# Patient Record
Sex: Female | Born: 1955 | Race: Black or African American | Hispanic: No | Marital: Single
Health system: Southern US, Community
[De-identification: ages and names within clinical notes are randomized; demographics above are authoritative.]

## PROBLEM LIST (undated history)

## (undated) DIAGNOSIS — I2510 Atherosclerotic heart disease of native coronary artery without angina pectoris: Secondary | ICD-10-CM

## (undated) DIAGNOSIS — E119 Type 2 diabetes mellitus without complications: Secondary | ICD-10-CM

## (undated) DIAGNOSIS — E785 Hyperlipidemia, unspecified: Secondary | ICD-10-CM

## (undated) DIAGNOSIS — I10 Essential (primary) hypertension: Secondary | ICD-10-CM

## (undated) HISTORY — DX: Essential (primary) hypertension: I10

## (undated) HISTORY — PX: ABDOMINAL HYSTERECTOMY: SHX81

## (undated) HISTORY — DX: Hyperlipidemia, unspecified: E78.5

---

## 1997-10-08 ENCOUNTER — Ambulatory Visit (HOSPITAL_COMMUNITY): Admission: RE | Admit: 1997-10-08 | Discharge: 1997-10-08 | Payer: Self-pay

## 1998-01-19 HISTORY — PX: KNEE SURGERY: SHX244

## 1998-06-25 ENCOUNTER — Encounter: Admission: RE | Admit: 1998-06-25 | Discharge: 1998-06-25 | Payer: Self-pay | Admitting: *Deleted

## 1998-10-18 ENCOUNTER — Encounter (HOSPITAL_BASED_OUTPATIENT_CLINIC_OR_DEPARTMENT_OTHER): Payer: Self-pay | Admitting: General Surgery

## 1998-10-18 ENCOUNTER — Ambulatory Visit (HOSPITAL_COMMUNITY): Admission: RE | Admit: 1998-10-18 | Discharge: 1998-10-18 | Payer: Self-pay | Admitting: General Surgery

## 1999-03-14 ENCOUNTER — Emergency Department (HOSPITAL_COMMUNITY): Admission: EM | Admit: 1999-03-14 | Discharge: 1999-03-14 | Payer: Self-pay

## 1999-03-14 ENCOUNTER — Encounter: Payer: Self-pay | Admitting: Emergency Medicine

## 1999-03-16 ENCOUNTER — Encounter: Payer: Self-pay | Admitting: Emergency Medicine

## 1999-03-16 ENCOUNTER — Emergency Department (HOSPITAL_COMMUNITY): Admission: EM | Admit: 1999-03-16 | Discharge: 1999-03-16 | Payer: Self-pay | Admitting: Emergency Medicine

## 1999-08-04 ENCOUNTER — Other Ambulatory Visit: Admission: RE | Admit: 1999-08-04 | Discharge: 1999-08-04 | Payer: Self-pay | Admitting: Gynecology

## 1999-08-14 ENCOUNTER — Ambulatory Visit (HOSPITAL_COMMUNITY): Admission: RE | Admit: 1999-08-14 | Discharge: 1999-08-14 | Payer: Self-pay | Admitting: Obstetrics and Gynecology

## 1999-08-14 ENCOUNTER — Encounter: Payer: Self-pay | Admitting: Obstetrics and Gynecology

## 2000-02-18 ENCOUNTER — Encounter (HOSPITAL_BASED_OUTPATIENT_CLINIC_OR_DEPARTMENT_OTHER): Payer: Self-pay | Admitting: General Surgery

## 2000-02-18 ENCOUNTER — Ambulatory Visit (HOSPITAL_COMMUNITY): Admission: RE | Admit: 2000-02-18 | Discharge: 2000-02-18 | Payer: Self-pay | Admitting: General Surgery

## 2001-03-22 ENCOUNTER — Ambulatory Visit (HOSPITAL_COMMUNITY): Admission: RE | Admit: 2001-03-22 | Discharge: 2001-03-22 | Payer: Self-pay | Admitting: Family Medicine

## 2001-03-22 ENCOUNTER — Encounter: Payer: Self-pay | Admitting: Family Medicine

## 2002-03-28 ENCOUNTER — Encounter: Payer: Self-pay | Admitting: Family Medicine

## 2002-03-28 ENCOUNTER — Ambulatory Visit (HOSPITAL_COMMUNITY): Admission: RE | Admit: 2002-03-28 | Discharge: 2002-03-28 | Payer: Self-pay | Admitting: Family Medicine

## 2003-04-03 ENCOUNTER — Ambulatory Visit (HOSPITAL_COMMUNITY): Admission: RE | Admit: 2003-04-03 | Discharge: 2003-04-03 | Payer: Self-pay | Admitting: Family Medicine

## 2003-05-02 ENCOUNTER — Ambulatory Visit (HOSPITAL_BASED_OUTPATIENT_CLINIC_OR_DEPARTMENT_OTHER): Admission: RE | Admit: 2003-05-02 | Discharge: 2003-05-02 | Payer: Self-pay | Admitting: Orthopedic Surgery

## 2003-05-02 ENCOUNTER — Ambulatory Visit (HOSPITAL_COMMUNITY): Admission: RE | Admit: 2003-05-02 | Discharge: 2003-05-02 | Payer: Self-pay | Admitting: Orthopedic Surgery

## 2003-07-16 ENCOUNTER — Ambulatory Visit: Admission: RE | Admit: 2003-07-16 | Discharge: 2003-07-16 | Payer: Self-pay | Admitting: Family Medicine

## 2003-07-17 ENCOUNTER — Inpatient Hospital Stay (HOSPITAL_COMMUNITY): Admission: EM | Admit: 2003-07-17 | Discharge: 2003-07-25 | Payer: Self-pay | Admitting: Emergency Medicine

## 2003-07-18 ENCOUNTER — Encounter (INDEPENDENT_AMBULATORY_CARE_PROVIDER_SITE_OTHER): Payer: Self-pay | Admitting: Specialist

## 2004-09-19 ENCOUNTER — Ambulatory Visit (HOSPITAL_COMMUNITY): Admission: RE | Admit: 2004-09-19 | Discharge: 2004-09-19 | Payer: Self-pay | Admitting: Family Medicine

## 2004-10-01 ENCOUNTER — Encounter: Admission: RE | Admit: 2004-10-01 | Discharge: 2004-10-01 | Payer: Self-pay | Admitting: Family Medicine

## 2005-10-12 ENCOUNTER — Ambulatory Visit (HOSPITAL_COMMUNITY): Admission: RE | Admit: 2005-10-12 | Discharge: 2005-10-12 | Payer: Self-pay | Admitting: Family Medicine

## 2006-10-15 ENCOUNTER — Ambulatory Visit (HOSPITAL_COMMUNITY): Admission: RE | Admit: 2006-10-15 | Discharge: 2006-10-15 | Payer: Self-pay | Admitting: Family Medicine

## 2007-10-24 ENCOUNTER — Ambulatory Visit (HOSPITAL_COMMUNITY): Admission: RE | Admit: 2007-10-24 | Discharge: 2007-10-24 | Payer: Self-pay | Admitting: Family Medicine

## 2008-11-12 ENCOUNTER — Encounter: Admission: RE | Admit: 2008-11-12 | Discharge: 2008-11-12 | Payer: Self-pay | Admitting: Family Medicine

## 2009-11-18 ENCOUNTER — Encounter: Admission: RE | Admit: 2009-11-18 | Discharge: 2009-11-18 | Payer: Self-pay | Admitting: Family Medicine

## 2010-02-09 ENCOUNTER — Encounter: Payer: Self-pay | Admitting: Family Medicine

## 2010-06-06 NOTE — Op Note (Signed)
NAMETEKEYA, GEFFERT NO.:  [id number]   MEDICAL RECORD NO.:  [id number]                   PATIENT TYPE:  INP   LOCATION:  0445                                 FACILITY:  [facility]   PHYSICIAN:  Leonie Man, M.D.                DATE OF BIRTH:  May 22, 1955   DATE OF PROCEDURE:  07/18/2003  DATE OF DISCHARGE:                                 OPERATIVE REPORT   PREOPERATIVE DIAGNOSIS:  Small bowel obstruction.   POSTOPERATIVE DIAGNOSIS:  Closed-loop small bowel obstruction.   PROCEDURE:  Exploratory laparotomy for relief of small bowel obstruction  with small bowel resection and anastomosis.   SURGEON:  Leonie Man, M.D.   ASSISTANT:  Lebron Conners, M.D.   ANESTHESIA:  General.   Patient is a 55 year old woman presenting with abdominal pain and a CT scan  showing enteritis.  On initial evaluation, she was noted to have a rather  soft abdomen and no peritoneal signs.  She was presently evaluated later to  find that her abdominal distention became more pronounced and associated  with leukocytosis.  A CT scan for the abdomen did in fact show an area of  obstruction consistent with what is possibly a closed-loop obstruction.  She  was brought to the operating room now for exploration.   PROCEDURE:  Following the induction of satisfactory general anesthesia with  the patient positioned supinely, the abdomen was prepped and draped  routinely.  Open laparotomy was carried through a midline adhesion.  Multiple adhesion from her previous hysterectomy to the anterior abdominal  wall were taken down, and the abdomen opened fully.  Upon entering the  abdomen, a large portion of infarcted intestine was noted in the distal  small bowel.  This area of obstruction was caused by a large adhesive band.  This was released.  The infarcted bowel was then divided proximally and  distally through the area of infarction and the intervening mesentery taken  between  clamps and secured with ties of 2-0 silk.  Functional end-to-end  anastomosis carried out with a GIA stapler and TIA-60 stapling devices to  perform a widely patent anastomosis.  The mesenteric defect was closed with  interrupted 0 Vicryl sutures.  The peritoneal cavity was then irrigated with  multiple aliquots of saline.  Sponge, instrument, and sharp counts were then  verified.  The abdominal wound was closed in layers, as follows:  The  midline closed with a running suture of  #1 PDS.  The subcutaneous tissues irrigated.  The skin was closed with  staples.  Sterile dressing was applied.  Anesthetic was reversed.  Patient  was moved from the operating room to the recovery room in stable condition.  Patient moved from the operating room to the recovery room in stable  condition.  She tolerated the procedure well.  Leonie Man, M.D.    PB/MEDQ  D:  07/18/2003  T:  07/18/2003  Job:  [id number]   cc:   Leonie Man, M.D.  [address]  [address]  [place]  [place] [address]  Fax: [phone]

## 2010-06-06 NOTE — H&P (Signed)
Samantha Soto, Samantha Soto NO.:  [id number]   MEDICAL RECORD NO.:  [id number]                   PATIENT TYPE:  INP   LOCATION:  0445                                 FACILITY:  [facility]   PHYSICIAN:  Deirdre Peer. Polite, M.D.              DATE OF BIRTH:  1955-10-23   DATE OF ADMISSION:  07/16/2003  DATE OF DISCHARGE:                                HISTORY & PHYSICAL   CHIEF COMPLAINT:  Abdominal pain, nausea and vomiting.   HISTORY OF PRESENT ILLNESS:  Patient is a 55 year old female with only past  medical history of high cholesterol, who presents to the ED with complaints  of abdominal pain, nausea and vomiting.  The patient states she has been  having pain in her stomach since Sunday in the epigastric area; today, had  nausea and vomiting several times.  The patient denies any bloody vomitus,  denies any fever or chills, does admit to some diarrhea today.  The patient  denies any sick contacts, denies any new medications, denies any well water,  denies any foreign travel.  In the ED today, the patient was evaluated and  found to be afebrile with significant abdominal discomfort.  CT of the  abdomen and pelvis was ordered which showed upper abdominal ascites, severe  wall thickening and multiple loops small bowel with a differential diagnosis  of severe enteritis, Crohn's disease, strangulation/ischemic bowel, with  surgical consultation recommended.  Surgical consultation was called by Dr.  Lavonda Jumbo.  She states she had spoke with Dr. Leonie Man, who saw the  patient, and stated that the patient does not have an acute abdomen and  recommended medicine evaluate the patient.  At the time of my evaluation,  the patient is very lethargic secondary to analgesia which was given for her  abdominal pain, therefore, most of the HPI was given per her sister.   PAST MEDICAL HISTORY:  Past medical history is as stated above, significant  for high cholesterol.   MEDICATIONS ON ADMISSION:  Medications on admission include Crestor.   SOCIAL HISTORY:  Social history negative for tobacco, alcohol or drugs.   PAST SURGICAL HISTORY:  1. Past surgical history is significant for knee surgery approximately 6     weeks ago secondary to a torn ligament.  2. The patient is status post hysterectomy in the distant past.   ALLERGIES:  No known drug allergies.   REVIEW OF SYSTEMS:  Review of systems as stated in the HPI.   FAMILY HISTORY:  Family history is noncontributory.   PHYSICAL EXAMINATION:  GENERAL:  Generally, the patient is somewhat  lethargic secondary to analgesia.  VITAL SIGNS:  Blood pressure 162/96, temperature 96.8, pulse 62, respiratory  rate of 26, saturating 100%.  HEENT:  Within normal limits.  CHEST:  Chest is clear to auscultation bilaterally.  CARDIOVASCULAR:  Regular.  No S3.  ABDOMEN:  Positive bowel sounds.  Slightly distended in the lower  abdomen  with also fullness in the lower abdomen and positive tympany.  EXTREMITIES:  No clubbing, cyanosis, or edema.  NEUROLOGIC:  Neurologic essentially nonfocal.   DATA:  CT scan of abdomen and pelvis showing upper abdominal ascites, severe  wall thickening with multiple loops of distal small bowel.   CBC:  White count 12.6, hemoglobin 14.1, hematocrit 43.7, MCV of 73.3,  platelets 371,000; neutrophil count 86%.  Sodium 136, potassium 3.7,  chloride 100, carbon dioxide 26, BUN 11, creatinine 0.9, AST and ALT 25 and  35, respectively, bilirubin 0.7.  INR 0.9.  Amylase 76, lipase 17.  Urine  pregnancy test negative.  UA:  Specific gravity 1.040, nitrite negative,  leukocyte esterase negative.   ASSESSMENT AND PLAN:  1. Abdominal pain associated with nausea and vomiting with an abnormal CT,     as stated above.  2. Leukocytosis.  3. Dehydration.  4. High cholesterol.   Recommend patient be admitted to a medicine floor bed, be made n.p.o., be  given judicious intravenous fluids and  intravenous antibiotics.  Surgical  consultation will be obtained.  Dr. Lynelle Doctor states that she has spoke with Dr.  Lurene Shadow, who has seen the patient in the emergency department.  I will re-  call, as there is no dictated or written report.  We will obtain a followup  abdominal series in the a.m.  The patient may require a gastroenterology  evaluation at a later time.                                               Deirdre Peer. Polite, M.D.    RDP/MEDQ  D:  07/17/2003  T:  07/17/2003  Job:  [id number]   cc:   [facility] Dr. Derrell Lolling

## 2010-06-06 NOTE — Discharge Summary (Signed)
Samantha Soto, Samantha Soto NO.:  [id number]   MEDICAL RECORD NO.:  [id number]                   PATIENT TYPE:  INP   LOCATION:  0473                                 FACILITY:  [facility]   PHYSICIAN:  Deirdre Peer. Polite, M.D.              DATE OF BIRTH:  Sep 23, 1955   DATE OF ADMISSION:  07/16/2003  DATE OF DISCHARGE:                                 DISCHARGE SUMMARY   DISCHARGE DIAGNOSES:  1. Small bowel obstruction status post small bowel resection with primary     anastomosis.  Tolerating p.o. at discharge.  2. Postoperative anemia.  Discharge hemoglobin 7.2. The patient not     transfused secondary to being a Jehovah Witness. The patient was given IV     iron and Aranesp during hospitalization.  3. High cholesterol.   DISCHARGE MEDICATIONS:  1. Patient to resume home medication Crestor.  2. Patient to take Vicodin 1-2 tabs every 4-6 hours as needed.  3. Ferrous gluconate 325 mg, 1 tab daily.  4. Milk of Magnesia p.r.n.  5. Aranesp 100 mcg q. week pending approval by the patient's insurance plan.   DISPOSITION:  Patient discharged to home in stable condition. Have patient  followup with primary M.D. in 1-2 weeks, followup with Dr. Lurene Shadow in 2  weeks.   CONSULTATIONS:  1. Leonie Man, M.D., surgery.  2. Danise Edge, M.D., GI.   PROCEDURE:  1. The patient underwent exploratory laparotomy on June 29 for a small bowel     obstruction, found to have closed loop small bowel obstruction treated     with small bowel resection and primary anastomosis.  2. The patient had a CT of the abdomen and pelvis which showed marked wall     thickening of multiple loops of distal small bowel potentially     representing Crohn's disease, ischemia, severe infectious enteritis.  3. Chest x-ray without infiltrate.  4. CBC on admission, white count 12.6, hemoglobin 14.1, platelet 371, BMET     within normal limits.  Hemoglobin at discharge, white count 7.2.  BMET  within normal limits.   HISTORY OF PRESENT ILLNESS:  A 55 year old female presented to the ED with  complaints of abdominal pain, nausea and vomiting. Evaluation in the ED  revealed abnormal CT as stated above and leukocytosis. Admission was deemed  necessary for further evaluation and treatment. Please see dictated HPI for  further details.   PAST MEDICAL HISTORY:  As stated above.   MEDICATIONS ON ADMISSION:  Crestor.   SOCIAL HISTORY:  Negative for tobacco, alcohol or drugs.   PAST SURGICAL HISTORY:  Significant for knee surgery approximately six weeks  ago for a torn ligament. The patient states she is status post hysterectomy  in the distal past.   ALLERGIES:  No known drug allergies.   FAMILY HISTORY:  Noncontributory.   HOSPITAL COURSE:  The patient was admitted to a medicine floor bed for  evaluation and treatment of  small bowel obstruction.  The patient was made  n.p.o., started on IV antibiotics and surgical consult obtained. It was  initially presumed that the patient had small bowel enteritis and GI  consultation was recommended; however, prior to GI doctor seeing the  patient, the patient's symptoms related to his small bowel obstruction  worsened necessitating the patient be taken to the OR.  In the OR, the  patient was found to have a closed loop small bowel obstruction and  underwent exploratory lap with small bowel resection and anastomosis.  The  patient's hospital course was one of slow but continued improvement from  that point on.  The patient did have postop anemia with hemoglobin down to  7.6.  Because of the patient's Jehovah Witness belief, she was unable to  take transfusions. The patient was started on Aranesp and IV iron.  In the  interim, the patient was seen by PT, OT and the patient was able to ambulate  the hall without any dyspnea, chest pain or shortness of breath.  It was  felt that the patient is tolerant of her anemia, therefore is stable  for  discharge.  The patient will continue her regular diet, will supplement with  ferrous sulfate. I have asked the social worker to contact the patient's  insurance company to see if they will cover a weekly Aranesp injection at  home to assist with reticulocytosis.  At this time, the patient is medically  stable for discharge.                                               Deirdre Peer. Polite, M.D.    RDP/MEDQ  D:  07/25/2003  T:  07/25/2003  Job:  [id number]   cc:   Bryan Lemma. Manus Gunning, M.D.  [address] [place]  [place] [address]  Fax: [phone]

## 2010-06-06 NOTE — Op Note (Signed)
Samantha Soto, Samantha Soto                     ACCOUNT NO.:  [id number]   MEDICAL RECORD NO.:  [id number]                   PATIENT TYPE:  AMB   LOCATION:  DSC                                  FACILITY:  [facility]   PHYSICIAN:  Feliberto Gottron. Turner Daniels, M.D.                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   POSTOPERATIVE DIAGNOSES:  Right knee chondromalacia with loose bodies and  definite medial meniscal tear.   OPERATION PERFORMED:  Right knee arthroscopic partial medial meniscectomy,  debridement of chondromalacia of the lateral tibial condyle, grade 3,  removal of loose bodies.   SURGEON:  Feliberto Gottron. Turner Daniels, M.D.   ASSISTANT:  None.   ANESTHESIA:  Local with IV sedation.   ESTIMATED BLOOD LOSS:  Minimal.   FLUIDS REPLACED:  800 mL crystalloids.   DRAINS:  None.   TOURNIQUET TIME:  None.   INDICATIONS FOR PROCEDURE:  The patient is a 55 year old woman with pain,  catching and popping in her right knee for many months.  She has failed  conservative treatment.  X-rays were relatively nondiagnostic.  Because of  the mechanical symptoms in her knee, she desires elective arthroscopic  evaluation and treatment of same.   DESCRIPTION OF PROCEDURE:  The patient was identified by arm band and taken  to the operating room at [facility].  Lateral post  applied to the table on the right side.  Appropriate anesthetic monitors  were attached and local anesthesia with intravenous sedation was induced.  The right lower extremity was prepped and draped in the usual sterile  fashion from the ankle to the midthigh and we began the procedure by making  standard inferomedial and inferolateral parapatellar portals with a #11  blade allowing introduction of the arthroscope through the  inferolateral  portal, outflow through the inferomedial portal.  Diagnostic arthroscopy  revealed a grade 2 chondromalacia of the patella that was lightly debrided.  The trochlea was in good condition.  Moving to the medial side, the patient  had a medial meniscal tear of the posterior horn which was debrided with  straight biters and a 3.5 Gator sucker shaver as was grade 3 chondromalacia  with some flap tears of the medial femoral condyle and removal of loose  bodies.  The ACL and the PCL were intact.  On the lateral side  chondromalacia of the lateral tibial condyle was identified and debrided.  Lateral meniscal tear, lateral and anterior horns was also debrided  minimally.  The gutters were cleared.  The scope was taken medial and  lateral to the PCL  clearing the posterior compartments.  The knee was irrigated out with normal  saline.  The arthroscopic instruments were removed.  A dressing of Xeroform,  4 x 4 dressing sponges, Webril and Ace wrap was applied.  The patient was  awakened and taken to the recovery room without difficulty.                                               Feliberto Gottron. Turner Daniels, M.D.    Ovid Curd  D:  05/02/2003  T:  05/03/2003  Job:  [id number]

## 2010-10-29 ENCOUNTER — Other Ambulatory Visit (HOSPITAL_COMMUNITY): Payer: Self-pay | Admitting: Family Medicine

## 2010-10-29 DIAGNOSIS — Z1231 Encounter for screening mammogram for malignant neoplasm of breast: Secondary | ICD-10-CM

## 2010-11-25 ENCOUNTER — Ambulatory Visit (HOSPITAL_COMMUNITY)
Admission: RE | Admit: 2010-11-25 | Discharge: 2010-11-25 | Disposition: A | Discharge status: Home / Self Care | Payer: BC Managed Care – PPO | Source: Ambulatory Visit | Attending: Family Medicine | Admitting: Family Medicine

## 2010-11-25 DIAGNOSIS — Z1231 Encounter for screening mammogram for malignant neoplasm of breast: Secondary | ICD-10-CM | POA: Insufficient documentation

## 2011-01-12 ENCOUNTER — Encounter: Payer: Self-pay | Admitting: *Deleted

## 2011-01-12 ENCOUNTER — Emergency Department (HOSPITAL_COMMUNITY)
Admission: EM | Admit: 2011-01-12 | Discharge: 2011-01-13 | Disposition: A | Discharge status: Home / Self Care | Payer: No Typology Code available for payment source | Attending: Emergency Medicine | Admitting: Emergency Medicine

## 2011-01-12 ENCOUNTER — Emergency Department (HOSPITAL_COMMUNITY): Payer: No Typology Code available for payment source

## 2011-01-12 DIAGNOSIS — M545 Low back pain, unspecified: Secondary | ICD-10-CM | POA: Insufficient documentation

## 2011-01-12 DIAGNOSIS — S39012A Strain of muscle, fascia and tendon of lower back, initial encounter: Secondary | ICD-10-CM

## 2011-01-12 DIAGNOSIS — S161XXA Strain of muscle, fascia and tendon at neck level, initial encounter: Secondary | ICD-10-CM

## 2011-01-12 DIAGNOSIS — S139XXA Sprain of joints and ligaments of unspecified parts of neck, initial encounter: Secondary | ICD-10-CM | POA: Insufficient documentation

## 2011-01-12 DIAGNOSIS — M542 Cervicalgia: Secondary | ICD-10-CM | POA: Insufficient documentation

## 2011-01-12 DIAGNOSIS — S335XXA Sprain of ligaments of lumbar spine, initial encounter: Secondary | ICD-10-CM | POA: Insufficient documentation

## 2011-01-12 DIAGNOSIS — IMO0001 Reserved for inherently not codable concepts without codable children: Secondary | ICD-10-CM | POA: Insufficient documentation

## 2011-01-12 MED ORDER — IBUPROFEN 800 MG PO TABS
800.0000 mg | ORAL_TABLET | Freq: Once | ORAL | Status: AC
  Administered 2011-01-12: 800 mg via ORAL
  Filled 2011-01-12: qty 1
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
## 2011-01-13 MED ORDER — HYDROCODONE-ACETAMINOPHEN 5-325 MG PO TABS: 2.0000 | ORAL_TABLET | ORAL | Status: AC | PRN

## 2011-01-13 MED ORDER — CYCLOBENZAPRINE HCL 10 MG PO TABS: 10.0000 mg | ORAL_TABLET | Freq: Two times a day (BID) | ORAL | Status: AC | PRN
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
## 2011-01-13 NOTE — ED Provider Notes (Signed)
Medical screening examination/treatment/procedure(s) were performed by non-physician practitioner and as supervising physician I was immediately available for consultation/collaboration.   Dione Booze, MD 01/13/11 [phone]

## 2011-11-06 ENCOUNTER — Other Ambulatory Visit: Payer: Self-pay | Admitting: Family Medicine

## 2011-11-06 DIAGNOSIS — Z1231 Encounter for screening mammogram for malignant neoplasm of breast: Secondary | ICD-10-CM

## 2011-12-07 ENCOUNTER — Ambulatory Visit
Admission: RE | Admit: 2011-12-07 | Discharge: 2011-12-07 | Disposition: A | Discharge status: Home / Self Care | Payer: BC Managed Care – PPO | Source: Ambulatory Visit | Attending: Family Medicine | Admitting: Family Medicine

## 2011-12-07 DIAGNOSIS — Z1231 Encounter for screening mammogram for malignant neoplasm of breast: Secondary | ICD-10-CM

## 2012-01-10 ENCOUNTER — Encounter (HOSPITAL_COMMUNITY): Payer: Self-pay | Admitting: Nurse Practitioner

## 2012-01-10 ENCOUNTER — Emergency Department (HOSPITAL_COMMUNITY)
Admission: EM | Admit: 2012-01-10 | Discharge: 2012-01-10 | Disposition: A | Discharge status: Home / Self Care | Payer: BC Managed Care – PPO | Attending: Emergency Medicine | Admitting: Emergency Medicine

## 2012-01-10 DIAGNOSIS — R509 Fever, unspecified: Secondary | ICD-10-CM | POA: Insufficient documentation

## 2012-01-10 DIAGNOSIS — J020 Streptococcal pharyngitis: Secondary | ICD-10-CM | POA: Insufficient documentation

## 2012-01-10 DIAGNOSIS — J0300 Acute streptococcal tonsillitis, unspecified: Secondary | ICD-10-CM

## 2012-01-10 DIAGNOSIS — Z79899 Other long term (current) drug therapy: Secondary | ICD-10-CM | POA: Insufficient documentation

## 2012-01-10 MED ORDER — PENICILLIN G BENZATHINE 1200000 UNIT/2ML IM SUSP
1.2000 10*6.[IU] | Freq: Once | INTRAMUSCULAR | Status: AC
  Administered 2012-01-10: 1.2 10*6.[IU] via INTRAMUSCULAR
  Filled 2012-01-10: qty 2

## 2012-01-10 MED ORDER — ACETAMINOPHEN 325 MG PO TABS
650.0000 mg | ORAL_TABLET | Freq: Once | ORAL | Status: AC
  Administered 2012-01-10: 650 mg via ORAL
  Filled 2012-01-10: qty 2

## 2012-01-10 NOTE — ED Notes (Signed)
Pt reports onset of fevers, sore throat, "feeling bad" yesterday. Taking ibuprofen with no relief.

## 2012-01-10 NOTE — ED Provider Notes (Signed)
History     CSN: [id number]  Arrival date & time 01/10/12  1818   First MD Initiated Contact with Patient 01/10/12 1850      Chief Complaint  Patient presents with  . Sore Throat  . Fever    HPI Onset of fever and sore throat suggest today.  She been taking ibuprofen but still fevers.  Pain when she swallows.  He is able handle secretions.  No breathing difficulties. History reviewed. No pertinent past medical history.  History reviewed. No pertinent past surgical history.  History reviewed. No pertinent family history.  History  Substance Use Topics  . Smoking status: Never Smoker   . Smokeless tobacco: Not on file  . Alcohol Use: No    OB History    Grav Para Term Preterm Abortions TAB SAB Ect Mult Living                  Review of Systems All other systems reviewed and are negative Allergies  Review of patient's allergies indicates no known allergies.  Home Medications   Current Outpatient Rx  Name  Route  Sig  Dispense  Refill  . IBUPROFEN 200 MG PO TABS   Oral   Take 400 mg by mouth every 6 (six) hours as needed. For pain         . LOVASTATIN 20 MG PO TABS   Oral   Take 20 mg by mouth at bedtime.           Carma Leaven M PLUS PO TABS   Oral   Take 1 tablet by mouth 2 (two) times daily.             BP 115/100  Pulse 124  Temp 102.3 F (39.1 C) (Oral)  SpO2 97%  Physical Exam  Nursing note and vitals reviewed. Constitutional: She is oriented to person, place, and time. She appears well-developed and well-nourished. No distress.  HENT:  Head: Normocephalic and atraumatic.  Mouth/Throat: Oropharyngeal exudate (Bilateral) present. No tonsillar abscesses (Some fullness on the left-hand side but no definite peritonsillar).  Eyes: Pupils are equal, round, and reactive to light.  Neck: Normal range of motion.  Cardiovascular: Normal rate and intact distal pulses.   Pulmonary/Chest: No respiratory distress.  Abdominal: Normal appearance. She  exhibits no distension.  Musculoskeletal: Normal range of motion.  Neurological: She is alert and oriented to person, place, and time. No cranial nerve deficit.  Skin: Skin is warm and dry. No rash noted.  Psychiatric: She has a normal mood and affect. Her behavior is normal.    ED Course  Procedures (including critical care time)  Labs Reviewed  RAPID STREP SCREEN - Abnormal; Notable for the following:    Streptococcus, Group A Screen (Direct) POSITIVE (*)     All other components within normal limits   No results found.   1. Strep tonsillitis       MDM  Patient was instructed on sore throat treatment at home including warm salt water gargles and Sucrets lozenges.  She was told to come back if no improvement or worsening in the next 24 hours to check for possible peritonsillar abscess.        Nelia Shi, MD 01/10/12 2219

## 2012-11-24 ENCOUNTER — Other Ambulatory Visit: Payer: Self-pay

## 2012-11-24 DIAGNOSIS — Z1231 Encounter for screening mammogram for malignant neoplasm of breast: Secondary | ICD-10-CM

## 2012-12-26 ENCOUNTER — Ambulatory Visit
Admission: RE | Admit: 2012-12-26 | Discharge: 2012-12-26 | Disposition: A | Discharge status: Home / Self Care | Payer: BC Managed Care – PPO | Source: Ambulatory Visit

## 2012-12-26 DIAGNOSIS — Z1231 Encounter for screening mammogram for malignant neoplasm of breast: Secondary | ICD-10-CM

## 2014-01-16 ENCOUNTER — Other Ambulatory Visit: Payer: Self-pay

## 2014-01-16 DIAGNOSIS — Z1231 Encounter for screening mammogram for malignant neoplasm of breast: Secondary | ICD-10-CM

## 2014-01-22 ENCOUNTER — Ambulatory Visit
Admission: RE | Admit: 2014-01-22 | Discharge: 2014-01-22 | Disposition: A | Discharge status: Home / Self Care | Payer: BLUE CROSS/BLUE SHIELD | Source: Ambulatory Visit

## 2014-01-22 DIAGNOSIS — Z1231 Encounter for screening mammogram for malignant neoplasm of breast: Secondary | ICD-10-CM

## 2014-06-11 ENCOUNTER — Other Ambulatory Visit (INDEPENDENT_AMBULATORY_CARE_PROVIDER_SITE_OTHER): Payer: BLUE CROSS/BLUE SHIELD

## 2014-06-11 ENCOUNTER — Encounter: Payer: Self-pay | Admitting: Internal Medicine

## 2014-06-11 ENCOUNTER — Ambulatory Visit (INDEPENDENT_AMBULATORY_CARE_PROVIDER_SITE_OTHER): Payer: BLUE CROSS/BLUE SHIELD | Admitting: Internal Medicine

## 2014-06-11 VITALS — BP 132/84 | HR 86 | Temp 98.1°F | Resp 12 | Ht 62.0 in | Wt 151.0 lb

## 2014-06-11 DIAGNOSIS — I10 Essential (primary) hypertension: Secondary | ICD-10-CM | POA: Diagnosis not present

## 2014-06-11 DIAGNOSIS — E785 Hyperlipidemia, unspecified: Secondary | ICD-10-CM

## 2014-06-11 DIAGNOSIS — E1169 Type 2 diabetes mellitus with other specified complication: Secondary | ICD-10-CM | POA: Insufficient documentation

## 2014-06-11 LAB — COMPREHENSIVE METABOLIC PANEL
ALBUMIN: 4.3 g/dL (ref 3.5–5.2)
ALT: 19 U/L (ref 0–35)
AST: 16 U/L (ref 0–37)
Alkaline Phosphatase: 84 U/L (ref 39–117)
BUN: 11 mg/dL (ref 6–23)
CO2: 30 mEq/L (ref 19–32)
Calcium: 9.5 mg/dL (ref 8.4–10.5)
Chloride: 102 mEq/L (ref 96–112)
Creatinine, Ser: 0.89 mg/dL (ref 0.40–1.20)
GFR: 83.74 mL/min (ref >60.00)
Glucose, Bld: 149 mg/dL — ABNORMAL HIGH (ref 70–99)
Potassium: 3.9 mEq/L (ref 3.5–5.1)
SODIUM: 137 meq/L (ref 135–145)
Total Bilirubin: 0.4 mg/dL (ref 0.2–1.2)
Total Protein: 7.6 g/dL (ref 6.0–8.3)

## 2014-06-11 LAB — LIPID PANEL
Cholesterol: 252 mg/dL — ABNORMAL HIGH (ref 0–200)
HDL: 59.5 mg/dL (ref >39.00)
LDL CALC: 164 mg/dL — AB (ref 0–99)
NonHDL: 192.5
TRIGLYCERIDES: 142 mg/dL (ref 0.0–149.0)
Total CHOL/HDL Ratio: 4
VLDL: 28.4 mg/dL (ref 0.0–40.0)

## 2014-06-11 MED ORDER — CLOBETASOL PROPIONATE 0.05 % EX CREA: 1.0000 "application " | TOPICAL_CREAM | Freq: Two times a day (BID) | CUTANEOUS | Status: DC

## 2014-06-11 MED ORDER — LOVASTATIN 20 MG PO TABS
20.0000 mg | ORAL_TABLET | Freq: Every day | ORAL | Status: DC
Start: 2014-06-11 — End: 2015-12-23

## 2014-06-11 MED ORDER — AMLODIPINE BESYLATE 5 MG PO TABS: 5.0000 mg | ORAL_TABLET | Freq: Every day | ORAL | Status: DC

## 2014-06-11 NOTE — Progress Notes (Signed)
Pre visit review using our clinic review tool, if applicable. No additional management support is needed unless otherwise documented below in the visit note. 

## 2014-06-11 NOTE — Progress Notes (Signed)
   Subjective:    Patient ID: Samantha Soto, female    DOB: 04/26/1955, 59 y.o.   MRN: [id number]  HPI The patient is a 59 YO female who is coming in today to check on her cholesterol and high blood pressure. She was worried that her blood pressure is high. She has been taking her medicines. She has also been trying to eat healthier with more greens and less fried foods. 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 She is not having any side effects with her medicines and she has been on the same dose for some time.   [facility], [facility] reviewed and updated.   Review of Systems  Constitutional: Negative for fever, activity change, appetite change, fatigue and unexpected weight change.  HENT: Negative.   Eyes: Negative.   Respiratory: Negative for cough, chest tightness, shortness of breath and wheezing.   Cardiovascular: Negative for chest pain, palpitations and leg swelling.  Gastrointestinal: Negative for nausea, abdominal pain, diarrhea, constipation and abdominal distention.  Musculoskeletal: Positive for arthralgias. Negative for myalgias, back pain and joint swelling.  Skin: Negative.   Neurological: Negative.   Psychiatric/Behavioral: Negative.       Objective:   Physical Exam  Constitutional: She is oriented to person, place, and time. She appears well-developed and well-nourished.  HENT:  Head: Normocephalic and atraumatic.  Eyes: EOM are normal.  Neck: Normal range of motion.  Cardiovascular: Normal rate and regular rhythm.   Pulmonary/Chest: Effort normal. No respiratory distress. She has no wheezes. She has no rales.  Abdominal: Soft. She exhibits no distension. There is no tenderness. There is no rebound.  Musculoskeletal: She exhibits no edema.  Neurological: She is alert and oriented to person, place, and time. Coordination normal.  Skin: Skin is warm and dry.   Filed Vitals:   06/11/14 1112  BP: 132/84  Pulse: 86    Temp: 98.1 F (36.7 C)  TempSrc: Oral  Resp: 12  Height: 5\' 2"  (1.575 m)  Weight: 151 lb (68.493 kg)  SpO2: 97%      Assessment & Plan:

## 2014-06-11 NOTE — Assessment & Plan Note (Signed)
She is currently taking amlodipine 5 mg daily and controlled. Checking BMP for any complications and adjust as needed.

## 2014-06-11 NOTE — Assessment & Plan Note (Signed)
Currently taking lovastatin 20 mg daily. She is working on [employer] and feels she gets enough exercise at work. Checking lipid panel and LFTs and adjust as needed.

## 2014-06-11 NOTE — Patient Instructions (Signed)
We will check the blood work today and call you back about the results.   We have sent in your refills for the medicines.   We will see you back next year if you are doing well. If you have any new problems or questions please feel free to call the office.   Health Maintenance Adopting a healthy lifestyle and getting preventive care can go a long way to promote health and wellness. Talk with your health care provider about what schedule of regular examinations is right for you. This is a good chance for you to check in with your provider about disease prevention and staying healthy. In between checkups, there are plenty of things you can do on your own. Experts have done a lot of research about which lifestyle changes and preventive measures are most likely to keep you healthy. Ask your health care provider for more information. WEIGHT AND DIET  Eat a healthy diet  Be sure to include plenty of vegetables, fruits, low-fat dairy products, and lean protein.  Do not eat a lot of foods high in solid fats, added sugars, or salt.  Get regular exercise. This is one of the most important things you can do for your health.  Most adults should exercise for at least 150 minutes each week. The exercise should increase your heart rate and make you sweat (moderate-intensity exercise).  Most adults should also do strengthening exercises at least twice a week. This is in addition to the moderate-intensity exercise.  Maintain a healthy weight  Body mass index (BMI) is a measurement that can be used to identify possible weight problems. It estimates body fat based on height and weight. Your health care provider can help determine your BMI and help you achieve or maintain a healthy weight.  For females 65 years of age and older:   A BMI below 18.5 is considered underweight.  A BMI of 18.5 to 24.9 is normal.  A BMI of 25 to 29.9 is considered overweight.  A BMI of 30 and above is considered obese.   Watch levels of cholesterol and blood lipids  You should start having your blood tested for lipids and cholesterol at 58 years of age, then have this test every 5 years.  You may need to have your cholesterol levels checked more often if:  Your lipid or cholesterol levels are high.  You are older than 58 years of age.  You are at high risk for heart disease.  CANCER SCREENING   Lung Cancer  Lung cancer screening is recommended for adults 45-87 years old who are at high risk for lung cancer because of a history of smoking.  A yearly low-dose CT scan of the lungs is recommended for people who:  Currently smoke.  Have quit within the past 15 years.  Have at least a 30-pack-year history of smoking. A pack year is smoking an average of one pack of cigarettes a day for 1 year.  Yearly screening should continue until it has been 15 years since you quit.  Yearly screening should stop if you develop a health problem that would prevent you from having lung cancer treatment.  Breast Cancer  Practice breast self-awareness. This means understanding how your breasts normally appear and feel.  It also means doing regular breast self-exams. Let your health care provider know about any changes, no matter how small.  If you are in your 20s or 30s, you should have a clinical breast exam (CBE) by a health  care provider every 1-3 years as part of a regular health exam.  If you are 73 or older, have a CBE every year. Also consider having a breast X-ray (mammogram) every year.  If you have a family history of breast cancer, talk to your health care provider about genetic screening.  If you are at high risk for breast cancer, talk to your health care provider about having an MRI and a mammogram every year.  Breast cancer gene (BRCA) assessment is recommended for women who have family members with BRCA-related cancers. BRCA-related cancers  include:  Breast.  Ovarian.  Tubal.  Peritoneal cancers.  Results of the assessment will determine the need for genetic counseling and BRCA1 and BRCA2 testing. Cervical Cancer Routine pelvic examinations to screen for cervical cancer are no longer recommended for nonpregnant women who are considered low risk for cancer of the pelvic organs (ovaries, uterus, and vagina) and who do not have symptoms. A pelvic examination may be necessary if you have symptoms including those associated with pelvic infections. Ask your health care provider if a screening pelvic exam is right for you.   The Pap test is the screening test for cervical cancer for women who are considered at risk.  If you had a hysterectomy for a problem that was not cancer or a condition that could lead to cancer, then you no longer need Pap tests.  If you are older than 65 years, and you have had normal Pap tests for the past 10 years, you no longer need to have Pap tests.  If you have had past treatment for cervical cancer or a condition that could lead to cancer, you need Pap tests and screening for cancer for at least 20 years after your treatment.  If you no longer get a Pap test, assess your risk factors if they change (such as having a new sexual partner). This can affect whether you should start being screened again.  Some women have medical problems that increase their chance of getting cervical cancer. If this is the case for you, your health care provider may recommend more frequent screening and Pap tests.  The human papillomavirus (HPV) test is another test that may be used for cervical cancer screening. The HPV test looks for the virus that can cause cell changes in the cervix. The cells collected during the Pap test can be tested for HPV.  The HPV test can be used to screen women 48 years of age and older. Getting tested for HPV can extend the interval between normal Pap tests from three to five years.  An HPV  test also should be used to screen women of any age who have unclear Pap test results.  After 58 years of age, women should have HPV testing as often as Pap tests.  Colorectal Cancer  This type of cancer can be detected and often prevented.  Routine colorectal cancer screening usually begins at 58 years of age and continues through 58 years of age.  Your health care provider may recommend screening at an earlier age if you have risk factors for colon cancer.  Your health care provider may also recommend using home test kits to check for hidden blood in the stool.  A small camera at the end of a tube can be used to examine your colon directly (sigmoidoscopy or colonoscopy). This is done to check for the earliest forms of colorectal cancer.  Routine screening usually begins at age 62.  Direct examination of the  colon should be repeated every 5-10 years through 58 years of age. However, you may need to be screened more often if early forms of precancerous polyps or small growths are found. Skin Cancer  Check your skin from head to toe regularly.  Tell your health care provider about any new moles or changes in moles, especially if there is a change in a mole's shape or color.  Also tell your health care provider if you have a mole that is larger than the size of a pencil eraser.  Always use sunscreen. Apply sunscreen liberally and repeatedly throughout the day.  Protect yourself by wearing long sleeves, pants, a wide-brimmed hat, and sunglasses whenever you are outside. HEART DISEASE, DIABETES, AND HIGH BLOOD PRESSURE   Have your blood pressure checked at least every 1-2 years. High blood pressure causes heart disease and increases the risk of stroke.  If you are between 78 years and 29 years old, ask your health care provider if you should take aspirin to prevent strokes.  Have regular diabetes screenings. This involves taking a blood sample to check your fasting blood sugar  level.  If you are at a normal weight and have a low risk for diabetes, have this test once every three years after 58 years of age.  If you are overweight and have a high risk for diabetes, consider being tested at a younger age or more often. PREVENTING INFECTION  Hepatitis B  If you have a higher risk for hepatitis B, you should be screened for this virus. You are considered at high risk for hepatitis B if:  You were born in a country where hepatitis B is common. Ask your health care provider which countries are considered high risk.  Your parents were born in a high-risk country, and you have not been immunized against hepatitis B (hepatitis B vaccine).  You have HIV or AIDS.  You use needles to inject street drugs.  You live with someone who has hepatitis B.  You have had sex with someone who has hepatitis B.  You get hemodialysis treatment.  You take certain medicines for conditions, including cancer, organ transplantation, and autoimmune conditions. Hepatitis C  Blood testing is recommended for:  Everyone born from [birth year] through 1964.  Anyone with known risk factors for hepatitis C. Sexually transmitted infections (STIs)  You should be screened for sexually transmitted infections (STIs) including gonorrhea and chlamydia if:  You are sexually active and are younger than 58 years of age.  You are older than 58 years of age and your health care provider tells you that you are at risk for this type of infection.  Your sexual activity has changed since you were last screened and you are at an increased risk for chlamydia or gonorrhea. Ask your health care provider if you are at risk.  If you do not have HIV, but are at risk, it may be recommended that you take a prescription medicine daily to prevent HIV infection. This is called pre-exposure prophylaxis (PrEP). You are considered at risk if:  You are sexually active and do not regularly use condoms or know the HIV status  of your partner(s).  You take drugs by injection.  You are sexually active with a partner who has HIV. Talk with your health care provider about whether you are at high risk of being infected with HIV. If you choose to begin PrEP, you should first be tested for HIV. You should then be tested every 3 months  for as long as you are taking PrEP.  PREGNANCY   If you are premenopausal and you may become pregnant, ask your health care provider about preconception counseling.  If you may become pregnant, take 400 to 800 micrograms (mcg) of folic acid every day.  If you want to prevent pregnancy, talk to your health care provider about birth control (contraception). OSTEOPOROSIS AND MENOPAUSE   Osteoporosis is a disease in which the bones lose minerals and strength with aging. This can result in serious bone fractures. Your risk for osteoporosis can be identified using a bone density scan.  If you are 64 years of age or older, or if you are at risk for osteoporosis and fractures, ask your health care provider if you should be screened.  Ask your health care provider whether you should take a calcium or vitamin D supplement to lower your risk for osteoporosis.  Menopause may have certain physical symptoms and risks.  Hormone replacement therapy may reduce some of these symptoms and risks. Talk to your health care provider about whether hormone replacement therapy is right for you.  HOME CARE INSTRUCTIONS   Schedule regular health, dental, and eye exams.  Stay current with your immunizations.   Do not use any tobacco products including cigarettes, chewing tobacco, or electronic cigarettes.  If you are pregnant, do not drink alcohol.  If you are breastfeeding, limit how much and how often you drink alcohol.  Limit alcohol intake to no more than 1 drink per day for nonpregnant women. One drink equals 12 ounces of beer, 5 ounces of wine, or 1 ounces of hard liquor.  Do not use street  drugs.  Do not share needles.  Ask your health care provider for help if you need support or information about quitting drugs.  Tell your health care provider if you often feel depressed.  Tell your health care provider if you have ever been abused or do not feel safe at home. Document Released: 07/21/2010 Document Revised: 05/22/2013 Document Reviewed: 12/07/2012 [facility] Patient Information 2015 [place], [place]. This information is not intended to replace advice given to you by your health care provider. Make sure you discuss any questions you have with your health care provider.

## 2014-07-02 ENCOUNTER — Encounter: Payer: Self-pay | Admitting: Internal Medicine

## 2014-07-02 ENCOUNTER — Ambulatory Visit (INDEPENDENT_AMBULATORY_CARE_PROVIDER_SITE_OTHER): Payer: BLUE CROSS/BLUE SHIELD | Admitting: Internal Medicine

## 2014-07-02 VITALS — BP 108/62 | HR 88 | Temp 98.7°F | Resp 16 | Ht 62.0 in | Wt 150.1 lb

## 2014-07-02 DIAGNOSIS — S8391XA Sprain of unspecified site of right knee, initial encounter: Secondary | ICD-10-CM | POA: Insufficient documentation

## 2014-07-02 DIAGNOSIS — S8391XD Sprain of unspecified site of right knee, subsequent encounter: Secondary | ICD-10-CM

## 2014-07-02 MED ORDER — TRIAMCINOLONE ACETONIDE 0.1 % EX CREA: 1.0000 "application " | TOPICAL_CREAM | Freq: Two times a day (BID) | CUTANEOUS | Status: DC

## 2014-07-02 MED ORDER — DICLOFENAC SODIUM 75 MG PO TBEC: 75.0000 mg | DELAYED_RELEASE_TABLET | Freq: Two times a day (BID) | ORAL | Status: DC

## 2014-07-02 NOTE — Patient Instructions (Addendum)
We have sent in voltaren pills which are stronger than ibuprofen. You take 1 pill twice a day to help with pain and swelling in the knee. If it is a sprain it should keep getting better gradually over time.   Make an appointment with the sports medicine doctor and if you are feeling better by the appointment you can cancel.  I have also sent in a replacement for the cream called triamcinolone cream which should be as good as your clobetasol cream.

## 2014-07-02 NOTE — Progress Notes (Signed)
   Subjective:    Patient ID: Samantha Soto, female    DOB: [birth year], 58 y.o.   MRN: [id number]  HPI The patient is a 59 YO female who is coming in today for right knee pain and swelling. She has been struggling with this for about 6 weeks or so and denies injury at onset. 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 Denies instability or collapse. Minimal swelling with increased activity. She ices the knee and takes ibuprofen twice per day with moderate relief of symptoms. Overall she thinks the pain is improving with time and the swelling less. Previous surgery in that knee in the past.   Review of Systems  Constitutional: Negative for fever, activity change, appetite change, fatigue and unexpected weight change.  Respiratory: Negative for cough, chest tightness, shortness of breath and wheezing.   Cardiovascular: Negative for chest pain, palpitations and leg swelling.  Gastrointestinal: Negative for nausea, abdominal pain, diarrhea, constipation and abdominal distention.  Musculoskeletal: Positive for arthralgias. Negative for myalgias, back pain and joint swelling.  Skin: Negative.   Neurological: Negative.       Objective:   Physical Exam  Constitutional: She is oriented to person, place, and time. She appears well-developed and well-nourished.  HENT:  Head: Normocephalic and atraumatic.  Eyes: EOM are normal.  Neck: Normal range of motion.  Cardiovascular: Normal rate and regular rhythm.   Pulmonary/Chest: Effort normal. No respiratory distress. She has no wheezes. She has no rales.  Abdominal: Soft. She exhibits no distension. There is no tenderness. There is no rebound.  Musculoskeletal: She exhibits tenderness. She exhibits no edema.  Some tenderness along the LCL right knee and trace swelling of the knee in comparison to left. ACL and PCL negative for signs of tears.  Neurological: She is alert and oriented to person, place, and time.  Coordination normal.  Skin: Skin is warm and dry.   Filed Vitals:   07/02/14 1304  BP: 108/62  Pulse: 88  Temp: 98.7 F (37.1 C)  TempSrc: Oral  Resp: 16  Height: 5\' 2"  (1.575 m)  Weight: 150 lb 1.9 oz (68.094 kg)  SpO2: 92%      Assessment & Plan:

## 2014-07-02 NOTE — Assessment & Plan Note (Signed)
No obvious injury, no ligament instability noted and no indication for imaging. Voltaren PO rx today for pain. Continue to ice and schedule visit with sports medicine in 2 weeks. If still having problems she will keep, if not she will call and cancel. Overall it is healing.

## 2014-07-02 NOTE — Progress Notes (Signed)
Pre visit review using our clinic review tool, if applicable. No additional management support is needed unless otherwise documented below in the visit note. 

## 2014-08-23 ENCOUNTER — Ambulatory Visit (INDEPENDENT_AMBULATORY_CARE_PROVIDER_SITE_OTHER): Payer: BLUE CROSS/BLUE SHIELD | Admitting: Family Medicine

## 2014-08-23 ENCOUNTER — Encounter: Payer: Self-pay | Admitting: Family Medicine

## 2014-08-23 ENCOUNTER — Other Ambulatory Visit (INDEPENDENT_AMBULATORY_CARE_PROVIDER_SITE_OTHER): Payer: BLUE CROSS/BLUE SHIELD

## 2014-08-23 VITALS — BP 136/82 | HR 94 | Ht 62.0 in | Wt 150.0 lb

## 2014-08-23 DIAGNOSIS — S83281A Other tear of lateral meniscus, current injury, right knee, initial encounter: Secondary | ICD-10-CM | POA: Diagnosis not present

## 2014-08-23 DIAGNOSIS — M25561 Pain in right knee: Secondary | ICD-10-CM

## 2014-08-23 DIAGNOSIS — S83289A Other tear of lateral meniscus, current injury, unspecified knee, initial encounter: Secondary | ICD-10-CM | POA: Insufficient documentation

## 2014-08-23 NOTE — Patient Instructions (Addendum)
Good to see you.  Ice 20 minutes 2 times daily. Usually after activity and before bed. Exercises 3 times a week.  pennsaid pinkie amount topically 2 times daily as needed.  Try brace Avoid twisting and jumping.  See me again in 3 weeks.

## 2014-08-23 NOTE — Progress Notes (Signed)
[facility] [address] [place], [place] [address] Phone: [phone] Subjective:    I'm seeing this patient by the request  of:  Judie Bonus, MD   CC: Right knee pain  BJY:NWGNFAOZHY Samantha Soto is a 59 y.o. female coming in with complaint of right knee pain. Patient was seen by primary care provider a month ago and was given some anti-inflammatory's. Patient states that unfortunately she continues to have swelling. States that there was no significant injury and states it is more of an insidious onset. Patient denies any instability or giving out on her. Patient states though that unfortunately she continues to have some intermittent swelling. Patient has been icing the knee as well as taking the anti-implant was fairly regularly. Patient does give history of a previous surgery in this knee.  Past Medical History  Diagnosis Date  . Hyperlipidemia   . Hypertension    Past Surgical History  Procedure Laterality Date  . Knee surgery Right 2000  . Abdominal hysterectomy     History  Substance Use Topics  . Smoking status: Never Smoker   . Smokeless tobacco: Not on file  . Alcohol Use: No   No Known Allergies Family History  Problem Relation Age of Onset  . Diabetes Sister   . Hyperlipidemia Sister   . Diabetes Brother         Past medical history, social, surgical and family history all reviewed in electronic medical record.   Review of Systems: No headache, visual changes, nausea, vomiting, diarrhea, constipation, dizziness, abdominal pain, skin rash, fevers, chills, night sweats, weight loss, swollen lymph nodes, body aches, joint swelling, muscle aches, chest pain, shortness of breath, mood changes.   Objective Blood pressure 136/82, pulse 94, height 5\' 2"  (1.575 m), weight 150 lb (68.04 kg), SpO2 97 %.  General: No apparent distress alert and oriented x3 mood and affect normal, dressed appropriately.  HEENT: Pupils  equal, extraocular movements intact  Respiratory: Patient's speak in full sentences and does not appear short of breath  Cardiovascular: No lower extremity edema, non tender, no erythema  Skin: Warm dry intact with no signs of infection or rash on extremities or on axial skeleton.  Abdomen: Soft nontender  Neuro: Cranial nerves II through XII are intact, neurovascularly intact in all extremities with 2+ DTRs and 2+ pulses.  Lymph: No lymphadenopathy of posterior or anterior cervical chain or axillae bilaterally.  Gait normal with good balance and coordination.  MSK:  Non tender with full range of motion and good stability and symmetric strength and tone of shoulders, elbows, wrist, hip, and ankles bilaterally.  Knee: Right +2 effusion noted Tender to palpation over the lateral joint line Decreasing range of motion lacking the last 5 of extension and only 95 of flexion Ligaments with solid consistent endpoints including ACL, PCL, LCL, MCL. Positive Mcmurray's, Apley's, and Thessalonian tests. Non painful patellar compression. Patellar glide with mild crepitus. Patellar and quadriceps tendons unremarkable. Hamstring and quadriceps strength is normal.  Contralateral knee unremarkable  MSK US performed of: right knee  This study was ordered, performed, and interpreted by Terrilee Files D.O.  Knee: All structures visualized. Significant large effusion noted Posterior medial meniscus has been removed. 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 No abnormality of prepatellar bursa. LCL and MCL unremarkable on long and transverse views. No abnormality of origin  of medial or lateral head of the gastrocnemius.  IMPRESSION:  Lateral meniscal tear with effusion  Procedure: Real-time Ultrasound Guided Injection of right knee Device: GE Logiq E  Ultrasound  guided injection is preferred based studies that show increased duration, increased effect, greater accuracy, decreased procedural pain, increased response rate, and decreased cost with ultrasound guided versus blind injection.  Verbal informed consent obtained.  Time-out conducted.  Noted no overlying erythema, induration, or other signs of local infection.  Skin prepped in a sterile fashion.  Local anesthesia: Topical Ethyl chloride.  With sterile technique and under real time ultrasound guidance: With a 22-gauge 2 inch needle patient was injected with 4 cc of 0.5% Marcaine and removed 40 mL of strawlike fluid then injected 1 cc of Kenalog 40 mg/dL. This was from a superior lateral approach.  Completed without difficulty  Pain immediately resolved suggesting accurate placement of the medication.  Advised to call if fevers/chills, erythema, induration, drainage, or persistent bleeding.  Images permanently stored and available for review in the ultrasound unit.  Impression: Technically successful ultrasound guided injection.    increasing range of motion immediately of the knee.   Procedure note 97110; 15 minutes spent for Therapeutic exercises as stated in above notes.  This included exercises focusing on stretching, strengthening, with significant focus on eccentric aspects.  Flexion and extension exercises focusing on vastus medialis oblique as well as hip abductor strengthening. Proper technique shown and discussed handout in great detail with ATC.  All questions were discussed and answered.   Impression and Recommendations:     This case required medical decision making of moderate complexity.

## 2014-08-23 NOTE — Progress Notes (Signed)
Pre visit review using our clinic review tool, if applicable. No additional management support is needed unless otherwise documented below in the visit note. 

## 2014-08-23 NOTE — Assessment & Plan Note (Signed)
Patient does have a large tear with some displacement but is not having any intra-articular derangement noted on symptoms. Patient did have a significant decrease in effusion after the aspiration. We will hope that there is no reaccumulation. Patient has anti-inflammatory needed and we will try topical anti-inflammatory. We discussed icing regimen as well as home exercises. Patient did work with a Psychologist, educational today to learn exercises in greater detail. Patient was given a brace for more support. Patient will come back and see me again in 3 weeks for further evaluation and treatment. Continuing to have trouble advanced imaging or formal physical therapy may be warranted.

## 2014-09-13 ENCOUNTER — Ambulatory Visit: Payer: BLUE CROSS/BLUE SHIELD | Admitting: Family Medicine

## 2014-10-04 ENCOUNTER — Encounter: Payer: Self-pay | Admitting: Family Medicine

## 2014-10-04 ENCOUNTER — Ambulatory Visit (INDEPENDENT_AMBULATORY_CARE_PROVIDER_SITE_OTHER): Payer: BLUE CROSS/BLUE SHIELD | Admitting: Family Medicine

## 2014-10-04 ENCOUNTER — Ambulatory Visit (INDEPENDENT_AMBULATORY_CARE_PROVIDER_SITE_OTHER)
Admission: RE | Admit: 2014-10-04 | Discharge: 2014-10-04 | Disposition: A | Discharge status: Home / Self Care | Payer: BLUE CROSS/BLUE SHIELD | Source: Ambulatory Visit | Attending: Family Medicine | Admitting: Family Medicine

## 2014-10-04 VITALS — BP 118/80 | HR 80 | Wt 150.0 lb

## 2014-10-04 DIAGNOSIS — M25562 Pain in left knee: Secondary | ICD-10-CM | POA: Diagnosis not present

## 2014-10-04 DIAGNOSIS — M25561 Pain in right knee: Secondary | ICD-10-CM

## 2014-10-04 DIAGNOSIS — G8929 Other chronic pain: Secondary | ICD-10-CM | POA: Diagnosis not present

## 2014-10-04 MED ORDER — MELOXICAM 15 MG PO TABS: 15.0000 mg | ORAL_TABLET | Freq: Every day | ORAL | Status: DC

## 2014-10-04 NOTE — Assessment & Plan Note (Signed)
Patient was given an injection in the left knee. Patient still has an effusion of both knees though. This seems somewhat concerning. We discussed the possibility of an autoimmune disease and possibly working out. Patient declined any lab values at this time. Patient will get x-rays of the knees for further evaluation. Discussed bracing, oral anti-inflammatory's, over-the-counter medications. Patient will try to make these different changes and wear proper shoes. Patient declined formal physical therapy. Patient will come back and see me again in 3-4 weeks. If worsening symptoms advance imaging and labs may be necessary but we will discuss at follow-up.  Spent  25 minutes with patient face-to-face and had greater than 50% of counseling including as described above in assessment and plan.

## 2014-10-04 NOTE — Patient Instructions (Addendum)
Good to see you Ice 20 minutes 2 times daily. Usually after activity and before bed. Continue the exercises bilaterally.  Meloxicam daily for 10 days then as needed xrays downstairs today.  See me again in 4 weeks.

## 2014-10-04 NOTE — Progress Notes (Signed)
[facility] [address] [place], [place] [address] Phone: [phone] Subjective:    I'm seeing this patient by the request  of:  Judie Bonus, MD   CC: Right knee pain follow-up   BJY:NWGNFAOZHY Samantha [facility] is a 59 y.o. female coming in with complaint of right knee pain. Patient was seen previously and did have a lateral meniscal tear. Patient also has had surgical intervention on this knee previously. Patient has been taking oral anti-inflammatory's in relatively doing well but has noticed that she is having the same pain now on the contralateral side. States that the swelling of both knees can occur. Not stopping her from any activities but do cause discomfort. Patient denies any locking or giving out on her but feels that the legs are somewhat weak. Patient states sometimes it can feel warm compared to the rest of her body as well. Denies any radiation of pain or any other joint pains  Past Medical History  Diagnosis Date  . Hyperlipidemia   . Hypertension    Past Surgical History  Procedure Laterality Date  . Knee surgery Right 2000  . Abdominal hysterectomy     Social History  Substance Use Topics  . Smoking status: Never Smoker   . Smokeless tobacco: None  . Alcohol Use: No   No Known Allergies Family History  Problem Relation Age of Onset  . Diabetes Sister   . Hyperlipidemia Sister   . Diabetes Brother         Past medical history, social, surgical and family history all reviewed in electronic medical record.   Review of Systems: No headache, visual changes, nausea, vomiting, diarrhea, constipation, dizziness, abdominal pain, skin rash, fevers, chills, night sweats, weight loss, swollen lymph nodes, body aches, joint swelling, muscle aches, chest pain, shortness of breath, mood changes.   Objective Blood pressure 118/80, pulse 80, weight 150 lb (68.04 kg).  General: No apparent distress alert and oriented x3 mood  and affect normal, dressed appropriately.  HEENT: Pupils equal, extraocular movements intact  Respiratory: Patient's speak in full sentences and does not appear short of breath  Cardiovascular: No lower extremity edema, non tender, no erythema  Skin: Warm dry intact with no signs of infection or rash on extremities or on axial skeleton.  Abdomen: Soft nontender  Neuro: Cranial nerves II through XII are intact, neurovascularly intact in all extremities with 2+ DTRs and 2+ pulses.  Lymph: No lymphadenopathy of posterior or anterior cervical chain or axillae bilaterally.  Gait normal with good balance and coordination.  MSK:  Non tender with full range of motion and good stability and symmetric strength and tone of shoulders, elbows, wrist, hip, and ankles bilaterally.  Knee: Bilateral +1 effusion bilaterally Tender to palpation over the lateral joint line on the right side but not on the left side Lacking the last 5 of extension and flexion bilaterally Ligaments with solid consistent endpoints including ACL, PCL, LCL, MCL. Positive Mcmurray's, Apley's, and Thessalonian tests only on right but not on the left but significantly improved. Non painful patellar compression. Patellar glide with mild crepitus. Patellar and quadriceps tendons unremarkable. Hamstring and quadriceps strength is normal.    MSK US performed of: bilateral  This study was ordered, performed, and interpreted by Terrilee Files D.O.  Knee: All structures visualized. Significant large effusion noted Posterior medial meniscus has been removed. \Patient does have a large acute on chronic tear of the lateral meniscus with approximately 50% displacement Patellar Tendon  unremarkable on long and transverse views without effusion. No abnormality of prepatellar bursa. LCL and MCL unremarkable on long and transverse views. No abnormality of origin of medial or lateral head of the gastrocnemius.  IMPRESSION:  effusions bilaterally,  healing left knee meniscus tear  Procedure: Real-time Ultrasound Guided Injection of Leftknee Device: GE Logiq E  Ultrasound guided injection is preferred based studies that show increased duration, increased effect, greater accuracy, decreased procedural pain, increased response rate, and decreased cost with ultrasound guided versus blind injection.  Verbal informed consent obtained.  Time-out conducted.  Noted no overlying erythema, induration, or other signs of local infection.  Skin prepped in a sterile fashion.  Local anesthesia: Topical Ethyl chloride.  With sterile technique and under real time ultrasound guidance: With a 22-gauge 2 inch needle patient was injected with 4 cc of 0.5% Marcaine and removed 40 mL of strawlike fluid then injected 1 cc of Kenalog 40 mg/dL. This was from a superior lateral approach.  Completed without difficulty  Pain immediately resolved suggesting accurate placement of the medication.  Advised to call if fevers/chills, erythema, induration, drainage, or persistent bleeding.  Images permanently stored and available for review in the ultrasound unit.  Impression: Technically successful ultrasound guided injection.    increasing range of motion immediately of the knee.     Impression and Recommendations:     This case required medical decision making of moderate complexity.

## 2014-11-23 ENCOUNTER — Telehealth: Payer: Self-pay | Admitting: Internal Medicine

## 2014-11-23 NOTE — Telephone Encounter (Signed)
Please call patient with x ray results at [phone]

## 2014-11-26 NOTE — Telephone Encounter (Signed)
Spoke with pt, discussed results, & scheduled pt a f/u appt.

## 2014-11-30 ENCOUNTER — Ambulatory Visit (INDEPENDENT_AMBULATORY_CARE_PROVIDER_SITE_OTHER): Payer: BLUE CROSS/BLUE SHIELD | Admitting: Family Medicine

## 2014-11-30 ENCOUNTER — Encounter: Payer: Self-pay | Admitting: Family Medicine

## 2014-11-30 ENCOUNTER — Other Ambulatory Visit (INDEPENDENT_AMBULATORY_CARE_PROVIDER_SITE_OTHER): Payer: BLUE CROSS/BLUE SHIELD

## 2014-11-30 VITALS — BP 122/84 | HR 77 | Ht 62.0 in | Wt 150.0 lb

## 2014-11-30 DIAGNOSIS — G5622 Lesion of ulnar nerve, left upper limb: Secondary | ICD-10-CM | POA: Diagnosis not present

## 2014-11-30 DIAGNOSIS — M7120 Synovial cyst of popliteal space [Baker], unspecified knee: Secondary | ICD-10-CM | POA: Insufficient documentation

## 2014-11-30 DIAGNOSIS — M25562 Pain in left knee: Secondary | ICD-10-CM

## 2014-11-30 DIAGNOSIS — M7122 Synovial cyst of popliteal space [Baker], left knee: Secondary | ICD-10-CM

## 2014-11-30 NOTE — Progress Notes (Signed)
Pre visit review using our clinic review tool, if applicable. No additional management support is needed unless otherwise documented below in the visit note. 

## 2014-11-30 NOTE — Patient Instructions (Signed)
Good to see you Ice in 6 hours Continue everything else If not a lot better we may need to consider MRI. Call me Monday or Tuesday Otherwise if doing well see me again in 3 weeks.

## 2014-11-30 NOTE — Progress Notes (Signed)
[facility] [address] [place], [place] [address] Phone: [phone] Subjective:    I'm seeing this patient by the request  of:  Myrlene Broker, MD   CC: leftknee pain follow-up   BJY:NWGNFAOZHY Samantha Soto is a 59 y.o. female coming in with complaint ofleft knee pain. Patient was found to have a right knee injury previously. Patient did have an injection in the left knee was doing relatively well but has noticed some fullness in the posterior aspect of the knee. Describes it as a throbbing aching sensation. Never gets better but never gets worse. Still able to do daily activities. Denies any numbness or tingling.  Past Medical History  Diagnosis Date  . Hyperlipidemia   . Hypertension    Past Surgical History  Procedure Laterality Date  . Knee surgery Right 2000  . Abdominal hysterectomy     Social History  Substance Use Topics  . Smoking status: Never Smoker   . Smokeless tobacco: Not on file  . Alcohol Use: No   No Known Allergies Family History  Problem Relation Age of Onset  . Diabetes Sister   . Hyperlipidemia Sister   . Diabetes Brother         Past medical history, social, surgical and family history all reviewed in electronic medical record.   Review of Systems: No headache, visual changes, nausea, vomiting, diarrhea, constipation, dizziness, abdominal pain, skin rash, fevers, chills, night sweats, weight loss, swollen lymph nodes, body aches, joint swelling, muscle aches, chest pain, shortness of breath, mood changes.   Objective There were no vitals taken for this visit.  General: No apparent distress alert and oriented x3 mood and affect normal, dressed appropriately.  HEENT: Pupils equal, extraocular movements intact  Respiratory: Patient's speak in full sentences and does not appear short of breath  Cardiovascular: No lower extremity edema, non tender, no erythema  Skin: Warm dry intact with no signs of  infection or rash on extremities or on axial skeleton.  Abdomen: Soft nontender  Neuro: Cranial nerves II through XII are intact, neurovascularly intact in all extremities with 2+ DTRs and 2+ pulses.  Lymph: No lymphadenopathy of posterior or anterior cervical chain or axillae bilaterally.  Gait normal with good balance and coordination.  MSK:  Non tender with full range of motion and good stability and symmetric strength and tone of shoulders, elbows, wrist, hip, and ankles bilaterally.  Knee: left No effusion.  Popliteal region TTP with cyst medially.  Lacking the last 5 of extension and flexion bilaterally Ligaments with solid consistent endpoints including ACL, PCL, LCL, MCL. Negative McMurray's Non painful patellar compression. Patellar glide with mild crepitus. Patellar and quadriceps tendons unremarkable. Hamstring and quadriceps strength is normal.  Contralateral knee unremarkable    Procedure: Real-time Ultrasound Guided Injection of left knee baker cyst.  Device: GE Logiq E  Ultrasound guided injection is preferred based studies that show increased duration, increased effect, greater accuracy, decreased procedural pain, increased response rate, and decreased cost with ultrasound guided versus blind injection.  Verbal informed consent obtained.  Time-out conducted.  Noted no overlying erythema, induration, or other signs of local infection.  Skin prepped in a sterile fashion.  Local anesthesia: Topical Ethyl chloride.  With sterile technique and under real time ultrasound guidance: With a 22-gauge 2 inch needle patient was injected with 4 cc of 0.5% Marcaine and removed 25 mL of strawlike fluid then injected 1 cc of Kenalog 40 mg/dL. This was from a  superior lateral approach.  Completed without difficulty  Pain immediately resolved suggesting accurate placement of the medication.  Advised to call if fevers/chills, erythema, induration, drainage, or persistent bleeding.    Images permanently stored and available for review in the ultrasound unit.  Impression: Technically successful ultrasound guided injection.    increasing range of motion immediately of the knee.     Impression and Recommendations:     This case required medical decision making of moderate complexity.

## 2014-11-30 NOTE — Assessment & Plan Note (Signed)
Aspirated today. We discussed home exercises, compression, and discuss with patient having recurrent swelling that advance imaging may be warranted later on. Patient will continue with the conservative therapy at this time. There is a concern for patient having an overlying ganglion cyst in the area. Denies any fevers or chills infection is significantly less likely. Patient also may need workup for autoimmune if continues.  Spent  25 minutes with patient face-to-face and had greater than 50% of counseling including as described above in assessment and plan.

## 2015-01-02 ENCOUNTER — Ambulatory Visit (INDEPENDENT_AMBULATORY_CARE_PROVIDER_SITE_OTHER): Payer: BLUE CROSS/BLUE SHIELD

## 2015-01-02 DIAGNOSIS — Z111 Encounter for screening for respiratory tuberculosis: Secondary | ICD-10-CM

## 2015-01-02 DIAGNOSIS — Z23 Encounter for immunization: Secondary | ICD-10-CM

## 2015-01-04 LAB — TB SKIN TEST
Induration: 0 mm
TB Skin Test: NEGATIVE

## 2015-01-10 ENCOUNTER — Telehealth: Payer: Self-pay | Admitting: Internal Medicine

## 2015-01-10 MED ORDER — TRAMADOL HCL 50 MG PO TABS: 50.0000 mg | ORAL_TABLET | Freq: Two times a day (BID) | ORAL | Status: DC | PRN

## 2015-01-10 NOTE — Telephone Encounter (Signed)
Discussed with pt, rx sent into pharmacy.  

## 2015-01-10 NOTE — Telephone Encounter (Signed)
Could do tramadol 50 mg bid prn if she would like and to take with a tylenol and then see me next week.

## 2015-01-10 NOTE — Telephone Encounter (Signed)
Pt called and is having some pain in her knee and is wondering if you can call her in something or does she need to come in for an OV She can be reached at [phone] Pharmacy is [facility] on [address]

## 2015-01-17 ENCOUNTER — Other Ambulatory Visit (INDEPENDENT_AMBULATORY_CARE_PROVIDER_SITE_OTHER): Payer: BLUE CROSS/BLUE SHIELD

## 2015-01-17 ENCOUNTER — Encounter: Payer: Self-pay | Admitting: Family Medicine

## 2015-01-17 ENCOUNTER — Ambulatory Visit (INDEPENDENT_AMBULATORY_CARE_PROVIDER_SITE_OTHER): Payer: BLUE CROSS/BLUE SHIELD | Admitting: Family Medicine

## 2015-01-17 VITALS — BP 122/84 | Ht 62.0 in | Wt 150.0 lb

## 2015-01-17 DIAGNOSIS — G8929 Other chronic pain: Secondary | ICD-10-CM

## 2015-01-17 DIAGNOSIS — M2550 Pain in unspecified joint: Secondary | ICD-10-CM | POA: Diagnosis not present

## 2015-01-17 DIAGNOSIS — M25562 Pain in left knee: Secondary | ICD-10-CM

## 2015-01-17 DIAGNOSIS — M25561 Pain in right knee: Secondary | ICD-10-CM

## 2015-01-17 LAB — TSH: TSH: 1.65 u[IU]/mL (ref 0.35–4.50)

## 2015-01-17 LAB — C-REACTIVE PROTEIN: CRP: 0.6 mg/dL (ref 0.5–20.0)

## 2015-01-17 LAB — SEDIMENTATION RATE: Sed Rate: 15 mm/hr (ref 0–22)

## 2015-01-17 MED ORDER — MELOXICAM 15 MG PO TABS: 15.0000 mg | ORAL_TABLET | Freq: Every day | ORAL | Status: DC

## 2015-01-17 MED ORDER — DICLOFENAC SODIUM 2 % TD SOLN: TRANSDERMAL | Status: DC

## 2015-01-17 NOTE — Progress Notes (Signed)
Pre visit review using our clinic review tool, if applicable. No additional management support is needed unless otherwise documented below in the visit note. 

## 2015-01-17 NOTE — Assessment & Plan Note (Signed)
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 It is concerning for potential rheumatoid arthritis. Labs ordered today. This will rule out this as a potential cause that would be causing the recurrent Baker cyst reaccumulation. Patient's x-rays do not show enough arthritis to likely be causing an sure joint increase in pressure to cause the Baker cyst. Has had surgery previously though that does [place] a Baker cyst on one side. Discussed with patient though that we can do aspiration but I would like to wait another 3 weeks with it being at least 10 weeks since last injection. We will see if we can aspirate more fluid. Otherwise if continuing pain that is not responding to conservative therapy advance imaging may be warranted in the future.  Spent  25 minutes with patient face-to-face and had greater than 50% of counseling including as described above in assessment and plan.

## 2015-01-17 NOTE — Progress Notes (Signed)
[facility] [address] [place], [place] [address] Phone: [phone] Subjective:    I'm seeing this patient by the request  of:  Myrlene Broker, MD   CC: leftknee pain follow-up   BJY:NWGNFAOZHY Samantha [facility] is a 59 y.o. female coming in with complaint ofleft knee pain. Patient previously was found to have a very large Baker cyst. Patient did have aspiration done. Appears the patient may have also had a ganglion cyst in the same vicinity. Patient statesnow both knees are giving her trouble. Feels in both lower swollen. Does not remember any true injury. States that from seemed to be more given her trouble with flexion and extension and when she has to go down on her knees. Patient states that the twisting motions and was given her difficulty previously seems to be somewhat better. Denies any radiation down the legs. Patient states that a lot of her muscle seemed to be aching. Would state overall that her problem is worsening.  Past Medical History  Diagnosis Date  . Hyperlipidemia   . Hypertension    Past Surgical History  Procedure Laterality Date  . Knee surgery Right 2000  . Abdominal hysterectomy     Social History  Substance Use Topics  . Smoking status: Never Smoker   . Smokeless tobacco: None  . Alcohol Use: No   No Known Allergies Family History  Problem Relation Age of Onset  . Diabetes Sister   . Hyperlipidemia Sister   . Diabetes Brother   possible family history rheumatoid arthritis      Past medical history, social, surgical and family history all reviewed in electronic medical record.   Review of Systems: No headache, visual changes, nausea, vomiting, diarrhea, constipation, dizziness, abdominal pain, skin rash, fevers, chills, night sweats, weight loss, swollen lymph nodes, body aches, joint swelling, muscle aches, chest pain, shortness of breath, mood changes.   Objective Blood pressure 122/84, height   (1.575 m), weight 150 lb (68.04 kg).  General: No apparent distress alert and oriented x3 mood and affect normal, dressed appropriately.  HEENT: Pupils equal, extraocular movements intact  Respiratory: Patient's speak in full sentences and does not appear short of breath  Cardiovascular: No lower extremity edema, non tender, no erythema  Skin: Warm dry intact with no signs of infection or rash on extremities or on axial skeleton.  Abdomen: Soft nontender  Neuro: Cranial nerves II through XII are intact, neurovascularly intact in all extremities with 2+ DTRs and 2+ pulses.  Lymph: No lymphadenopathy of posterior or anterior cervical chain or axillae bilaterally.  Gait normal with good balance and coordination.  MSK:  Non tender with full range of motion and good stability and symmetric strength and tone of shoulders, elbows, wrist, hip, and ankles bilaterally.  Knee: bilateral Mild effusion but has one on the contralateral sign. Popliteal region TTP with cyst medially laterally left greater than right  Lacking the last 5 of extension and flexion bilaterally Ligaments with solid consistent endpoints including ACL, PCL, LCL, MCL. Negative McMurray's mildpainful patellar compression. Patellar glide with mild crepitus. Patellar and quadriceps tendons unremarkable. Hamstring and quadriceps strength is normal.     MSK US performed of: lateral This study was ordered, performed, and interpreted by Terrilee Files D.O.  Knee: All structures visualized. Narrowing of the medial joint lineas well as the patellofemoral joint. bilaterally. Baker's cyst noted bilaterally left complex compared to the right one being more simple. Left Baker's cyst measures approximately 2.5  cm in diameter. Right Baker's cyst is approximate one center. Patellar Tendon unremarkable on long and transverse views without effusion. No abnormality of prepatellar bursa. LCL and MCL unremarkable on long and transverse views. No  abnormality of origin of medial or lateral head of the gastrocnemius.  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       Impression and Recommendations:     This case required medical decision making of moderate complexity.

## 2015-01-17 NOTE — Patient Instructions (Signed)
Good to see you Samantha Soto is your friend Meloxicam daily for 30 days but stop if it hurts your stomach.  Call the other pharmacy for the pennsaid They will send it to your house We will get labs today to make sure no autoimmune disease is causing some of the pain.  Come back in 4 weeks and if knees still hurt we will do injection.  Happy New Year!

## 2015-01-18 ENCOUNTER — Telehealth: Payer: Self-pay

## 2015-01-18 LAB — CYCLIC CITRUL PEPTIDE ANTIBODY, IGG: Cyclic Citrullin Peptide Ab: <16 Units

## 2015-01-18 LAB — ANGIOTENSIN CONVERTING ENZYME: Angiotensin-Converting Enzyme: 53 U/L — ABNORMAL HIGH (ref 8–52)

## 2015-01-18 LAB — ANTI-DNA ANTIBODY, DOUBLE-STRANDED

## 2015-01-18 LAB — ANA: ANA: NEGATIVE

## 2015-01-18 NOTE — Telephone Encounter (Signed)
Spoke with patient and informed her that labs were normal and to continue with original plan

## 2015-02-14 ENCOUNTER — Ambulatory Visit: Payer: BLUE CROSS/BLUE SHIELD | Admitting: Family Medicine

## 2015-03-13 ENCOUNTER — Encounter: Payer: Self-pay | Admitting: Family Medicine

## 2015-03-13 ENCOUNTER — Ambulatory Visit (INDEPENDENT_AMBULATORY_CARE_PROVIDER_SITE_OTHER): Payer: BLUE CROSS/BLUE SHIELD | Admitting: Family Medicine

## 2015-03-13 ENCOUNTER — Other Ambulatory Visit (INDEPENDENT_AMBULATORY_CARE_PROVIDER_SITE_OTHER): Payer: BLUE CROSS/BLUE SHIELD

## 2015-03-13 VITALS — BP 136/82 | HR 83 | Wt 147.0 lb

## 2015-03-13 DIAGNOSIS — M25561 Pain in right knee: Secondary | ICD-10-CM | POA: Diagnosis not present

## 2015-03-13 DIAGNOSIS — M25562 Pain in left knee: Secondary | ICD-10-CM

## 2015-03-13 DIAGNOSIS — G8929 Other chronic pain: Secondary | ICD-10-CM

## 2015-03-13 MED ORDER — MELOXICAM 15 MG PO TABS: 15.0000 mg | ORAL_TABLET | Freq: Every day | ORAL | Status: DC

## 2015-03-13 NOTE — Progress Notes (Signed)
[facility] [address] [place], [place] [address] Phone: [phone] Subjective:    I'm seeing this patient by the request  of:  Myrlene Broker, MD   CC: leftknee pain follow-up   BJY:[id number] Samantha Soto is a 60 y.o. female coming in with complaint ofleft knee pain. Patient previously was found to have a very large Baker cyst. Patient continued to have Baker cyst appear aspiration but seemed to be doing better. Patient was to continue home exercises as well as stay active. We did test labs for autoimmune disease which were unremarkable. These were reviewed again by me today. Patient states the pain is starting to worsen again. Last injection back in September. Having worsening problems at this time. Patient describes the pain is dull throbbing aching sensation. States that the swelling is stopping her from range of motion. Rates the severity of pain is 8 out of 10.  Past Medical History  Diagnosis Date  . Hyperlipidemia   . Hypertension    Past Surgical History  Procedure Laterality Date  . Knee surgery Right 2000  . Abdominal hysterectomy     Social History  Substance Use Topics  . Smoking status: Never Smoker   . Smokeless tobacco: Not on file  . Alcohol Use: No   No Known Allergies Family History  Problem Relation Age of Onset  . Diabetes Sister   . Hyperlipidemia Sister   . Diabetes Brother   possible family history rheumatoid arthritis      Past medical history, social, surgical and family history all reviewed in electronic medical record.   Review of Systems: No headache, visual changes, nausea, vomiting, diarrhea, constipation, dizziness, abdominal pain, skin rash, fevers, chills, night sweats, weight loss, swollen lymph nodes, body aches, joint swelling, muscle aches, chest pain, shortness of breath, mood changes.   Objective There were no vitals taken for this visit.  General: No apparent distress alert and  oriented x3 mood and affect normal, dressed appropriately.  HEENT: Pupils equal, extraocular movements intact  Respiratory: Patient's speak in full sentences and does not appear short of breath  Cardiovascular: No lower extremity edema, non tender, no erythema  Skin: Warm dry intact with no signs of infection or rash on extremities or on axial skeleton.  Abdomen: Soft nontender  Neuro: Cranial nerves II through XII are intact, neurovascularly intact in all extremities with 2+ DTRs and 2+ pulses.  Lymph: No lymphadenopathy of posterior or anterior cervical chain or axillae bilaterally.  Gait normal with good balance and coordination.  MSK:  Non tender with full range of motion and good stability and symmetric strength and tone of shoulders, elbows, wrist, hip, and ankles bilaterally.  Knee: bilateral Large effusion of the right knee with a trace effusion on the left knee Popliteal region still tender to palpation bilaterally right greater than left  Lacking the last 5 of extension and flexion bilaterally Ligaments with solid consistent endpoints including ACL, PCL, LCL, MCL. Mild positive McMurray's on the right side which is a new finding mildpainful patellar compression. Patellar glide with mild crepitus. Patellar and quadriceps tendons unremarkable. Hamstring and quadriceps strength is normal.     MSK US performed of: Bilateral knees This study was ordered, performed, and interpreted by Terrilee Files D.O.  Knee: All structures visualized. Narrowing of the medial joint lineas well as the patellofemoral joint. bilaterally. Baker's cyst noted bilaterally left complex compared to the right one being more simple. Left Baker's cyst measures approximately 2.5  cm in diameter. Right Baker's cyst is approximate one center. Patient also has a very large effusion of the right knee. Patellar Tendon unremarkable on long and transverse views without effusion. No abnormality of prepatellar bursa. LCL  and MCL unremarkable on long and transverse views. No abnormality of origin of medial or lateral head of the gastrocnemius.  IMPRESSION:  Bilateral knee swelling noted   Procedure: Real-time Ultrasound Guided Injection of right knee Device: GE Logiq E  Ultrasound guided injection is preferred based studies that show increased duration, increased effect, greater accuracy, decreased procedural pain, increased response rate, and decreased cost with ultrasound guided versus blind injection.  Verbal informed consent obtained.  Time-out conducted.  Noted no overlying erythema, induration, or other signs of local infection.  Skin prepped in a sterile fashion.  Local anesthesia: Topical Ethyl chloride.  With sterile technique and under real time ultrasound guidance: With a 22-gauge 2 inch needle patient was injected with 4 cc of 0.5% Marcaine and aspirated 30 mL of strawlike fluid then injected 1 cc of Kenalog 40 mg/dL. This was from a superior lateral approach.  Completed without difficulty  Pain immediately resolved suggesting accurate placement of the medication.  Advised to call if fevers/chills, erythema, induration, drainage, or persistent bleeding.  Images permanently stored and available for review in the ultrasound unit.  Impression: Technically successful ultrasound guided injection.   Procedure: Real-time Ultrasound Guided Injection of left knee Device: GE Logiq E  Ultrasound guided injection is preferred based studies that show increased duration, increased effect, greater accuracy, decreased procedural pain, increased response rate, and decreased cost with ultrasound guided versus blind injection.  Verbal informed consent obtained.  Time-out conducted.  Noted no overlying erythema, induration, or other signs of local infection.  Skin prepped in a sterile fashion.  Local anesthesia: Topical Ethyl chloride.  With sterile technique and under real time ultrasound guidance: With a  22-gauge 2 inch needle patient was injected with 4 cc of 0.5% Marcaine and aspirated 15 mL of strawlike fluid then injected 1 cc of Kenalog 40 mg/dL. This was from a superior lateral approach.  Completed without difficulty  Pain immediately resolved suggesting accurate placement of the medication.  Advised to call if fevers/chills, erythema, induration, drainage, or persistent bleeding.  Images permanently stored and available for review in the ultrasound unit.  Impression: Technically successful ultrasound guided injection.  Impression and Recommendations:     This case required medical decision making of moderate complexity.

## 2015-03-13 NOTE — Assessment & Plan Note (Signed)
Patient was given aspirations again today. Tolerated the procedure very well. Patient is artery had workup for autoimmune disease that was unremarkable. We discussed possibly advance imaging with patient having some instability of the knees. Patient stated that if in 2 weeks not doing significant better we will consider. Otherwise if injections are working every 3-4 months I would be safe as well. MRI would be necessary to evaluate the amount of osteophytic changes and see if arthroscopic procedure would be of benefit. Patient will continue with the patellofemoral exercises. Patient will continue avoiding certain activities. Patient does have topical anti-inflammatories was given orally 3. Follow-up again in 2-4 weeks for further evaluation and treatment.

## 2015-03-13 NOTE — Patient Instructions (Addendum)
Good to see you  Ice is your friend Good to see you  Try meloxicam daily for 5 days then as needed COntinue the vitamins and add fish oil 2 grams daily  If not great in 2 weeks call or send a message and we will get MRI.  Otherwise we will repeat injection every 3 months.

## 2015-04-04 ENCOUNTER — Other Ambulatory Visit: Payer: Self-pay

## 2015-04-04 DIAGNOSIS — Z1231 Encounter for screening mammogram for malignant neoplasm of breast: Secondary | ICD-10-CM

## 2015-04-16 ENCOUNTER — Other Ambulatory Visit: Payer: Self-pay | Admitting: Family Medicine

## 2015-04-16 NOTE — Telephone Encounter (Signed)
Refill done.  

## 2015-05-06 ENCOUNTER — Ambulatory Visit
Admission: RE | Admit: 2015-05-06 | Discharge: 2015-05-06 | Disposition: A | Discharge status: Home / Self Care | Payer: BLUE CROSS/BLUE SHIELD | Source: Ambulatory Visit

## 2015-05-06 DIAGNOSIS — Z1231 Encounter for screening mammogram for malignant neoplasm of breast: Secondary | ICD-10-CM

## 2015-05-08 ENCOUNTER — Other Ambulatory Visit: Payer: Self-pay | Admitting: Family Medicine

## 2015-05-08 DIAGNOSIS — R928 Other abnormal and inconclusive findings on diagnostic imaging of breast: Secondary | ICD-10-CM

## 2015-05-21 ENCOUNTER — Other Ambulatory Visit: Payer: BLUE CROSS/BLUE SHIELD

## 2015-05-23 ENCOUNTER — Other Ambulatory Visit: Payer: Self-pay | Admitting: Family Medicine

## 2015-05-23 DIAGNOSIS — R928 Other abnormal and inconclusive findings on diagnostic imaging of breast: Secondary | ICD-10-CM

## 2015-05-30 ENCOUNTER — Other Ambulatory Visit: Payer: Self-pay | Admitting: Family Medicine

## 2015-05-30 ENCOUNTER — Ambulatory Visit
Admission: RE | Admit: 2015-05-30 | Discharge: 2015-05-30 | Disposition: A | Discharge status: Home / Self Care | Payer: BLUE CROSS/BLUE SHIELD | Source: Ambulatory Visit | Attending: Family Medicine | Admitting: Family Medicine

## 2015-05-30 DIAGNOSIS — R928 Other abnormal and inconclusive findings on diagnostic imaging of breast: Secondary | ICD-10-CM

## 2015-06-10 ENCOUNTER — Ambulatory Visit: Payer: BLUE CROSS/BLUE SHIELD | Admitting: Family Medicine

## 2015-06-14 ENCOUNTER — Ambulatory Visit
Admission: RE | Admit: 2015-06-14 | Discharge: 2015-06-14 | Disposition: A | Discharge status: Home / Self Care | Payer: BLUE CROSS/BLUE SHIELD | Source: Ambulatory Visit | Attending: Family Medicine | Admitting: Family Medicine

## 2015-06-14 ENCOUNTER — Other Ambulatory Visit: Payer: Self-pay | Admitting: Family Medicine

## 2015-06-14 DIAGNOSIS — R928 Other abnormal and inconclusive findings on diagnostic imaging of breast: Secondary | ICD-10-CM

## 2015-08-02 ENCOUNTER — Telehealth: Payer: Self-pay | Admitting: Emergency Medicine

## 2015-08-02 NOTE — Telephone Encounter (Signed)
Pt called and wanted to know if you can refer her to an Engineer, siterthropedic Surgeon. Please give pt a call back thanks.

## 2015-08-03 NOTE — Telephone Encounter (Signed)
I would consider MRI of one of the knees first if she would like.  But otherwise refer her to [facility].  Gra[facility], dalldorf or [facility].

## 2015-08-08 NOTE — Telephone Encounter (Signed)
Spoke with patient on recommendations. She said she would get back to us if she wanted MRI. Will leave samples of Duexis at front desk for her(2boxes)

## 2015-08-09 ENCOUNTER — Telehealth: Payer: Self-pay | Admitting: Internal Medicine

## 2015-08-09 ENCOUNTER — Telehealth: Payer: Self-pay | Admitting: Emergency Medicine

## 2015-08-09 ENCOUNTER — Other Ambulatory Visit: Payer: Self-pay

## 2015-08-09 DIAGNOSIS — G8929 Other chronic pain: Secondary | ICD-10-CM

## 2015-08-09 DIAGNOSIS — M25561 Pain in right knee: Principal | ICD-10-CM

## 2015-08-09 NOTE — Telephone Encounter (Signed)
No way will insurance cover both at the same time.  ONly one, will start with the one with mild arthritis on xray

## 2015-08-09 NOTE — Telephone Encounter (Signed)
Pt called in and wanted to know since she is going to get an MRI done on 1 knee can she get it on both knees.  She has swelling in both knees

## 2015-08-09 NOTE — Telephone Encounter (Signed)
Insurance will likely not cover both knees but I will pass onto Dr. Katrinka BlazingSmith

## 2015-08-09 NOTE — Telephone Encounter (Signed)
Pt called and wants to know if you can schedule her an MRI. Please follow up thanks.

## 2015-08-15 ENCOUNTER — Other Ambulatory Visit: Payer: Self-pay

## 2015-08-15 ENCOUNTER — Telehealth: Payer: Self-pay | Admitting: Emergency Medicine

## 2015-08-15 DIAGNOSIS — G8929 Other chronic pain: Secondary | ICD-10-CM

## 2015-08-15 DIAGNOSIS — M25562 Pain in left knee: Principal | ICD-10-CM

## 2015-08-15 DIAGNOSIS — M25561 Pain in right knee: Principal | ICD-10-CM

## 2015-08-15 NOTE — Telephone Encounter (Signed)
Pt request records to take to the surgeon. Please follow up thanks.

## 2015-08-15 NOTE — Telephone Encounter (Signed)
Referral placed to [facility]. 

## 2015-08-18 ENCOUNTER — Ambulatory Visit
Admission: RE | Admit: 2015-08-18 | Discharge: 2015-08-18 | Disposition: A | Discharge status: Home / Self Care | Payer: BLUE CROSS/BLUE SHIELD | Source: Ambulatory Visit | Attending: Family Medicine | Admitting: Family Medicine

## 2015-08-18 DIAGNOSIS — M25561 Pain in right knee: Principal | ICD-10-CM

## 2015-08-18 DIAGNOSIS — G8929 Other chronic pain: Secondary | ICD-10-CM

## 2015-11-22 ENCOUNTER — Other Ambulatory Visit: Payer: Self-pay | Admitting: Internal Medicine

## 2015-11-22 DIAGNOSIS — N6001 Solitary cyst of right breast: Secondary | ICD-10-CM

## 2015-12-16 ENCOUNTER — Other Ambulatory Visit: Payer: BLUE CROSS/BLUE SHIELD

## 2015-12-23 ENCOUNTER — Telehealth: Payer: Self-pay

## 2015-12-23 MED ORDER — LOVASTATIN 20 MG PO TABS: 20.0000 mg | ORAL_TABLET | Freq: Every day | ORAL | 0 refills | Status: DC

## 2015-12-23 MED ORDER — AMLODIPINE BESYLATE 5 MG PO TABS: 5.0000 mg | ORAL_TABLET | Freq: Every day | ORAL | 0 refills | Status: DC

## 2015-12-23 NOTE — Telephone Encounter (Signed)
Pt called and is rq rf for the BP med and Lipid Medication.   Pt states that she has a cough.   LOV was 06/2014. Please advise.

## 2015-12-23 NOTE — Telephone Encounter (Signed)
Ok to send in 1 month of amlodipine, lovastatin but needs physical in the next month.

## 2016-01-07 ENCOUNTER — Encounter: Payer: BLUE CROSS/BLUE SHIELD | Admitting: Internal Medicine

## 2016-01-21 ENCOUNTER — Other Ambulatory Visit: Payer: Self-pay | Admitting: Internal Medicine

## 2016-02-03 ENCOUNTER — Ambulatory Visit
Admission: RE | Admit: 2016-02-03 | Discharge: 2016-02-03 | Disposition: A | Discharge status: Home / Self Care | Payer: BLUE CROSS/BLUE SHIELD | Source: Ambulatory Visit | Attending: Internal Medicine | Admitting: Internal Medicine

## 2016-02-03 ENCOUNTER — Ambulatory Visit (INDEPENDENT_AMBULATORY_CARE_PROVIDER_SITE_OTHER): Payer: BLUE CROSS/BLUE SHIELD | Admitting: Internal Medicine

## 2016-02-03 ENCOUNTER — Encounter: Payer: Self-pay | Admitting: Internal Medicine

## 2016-02-03 ENCOUNTER — Other Ambulatory Visit (INDEPENDENT_AMBULATORY_CARE_PROVIDER_SITE_OTHER): Payer: BLUE CROSS/BLUE SHIELD

## 2016-02-03 VITALS — BP 130/80 | HR 74 | Temp 97.5°F | Resp 12 | Ht 62.0 in | Wt 147.0 lb

## 2016-02-03 DIAGNOSIS — Z0000 Encounter for general adult medical examination without abnormal findings: Secondary | ICD-10-CM

## 2016-02-03 DIAGNOSIS — Z23 Encounter for immunization: Secondary | ICD-10-CM | POA: Diagnosis not present

## 2016-02-03 DIAGNOSIS — N6001 Solitary cyst of right breast: Secondary | ICD-10-CM

## 2016-02-03 DIAGNOSIS — E785 Hyperlipidemia, unspecified: Secondary | ICD-10-CM

## 2016-02-03 DIAGNOSIS — Z111 Encounter for screening for respiratory tuberculosis: Secondary | ICD-10-CM

## 2016-02-03 DIAGNOSIS — I10 Essential (primary) hypertension: Secondary | ICD-10-CM

## 2016-02-03 LAB — COMPREHENSIVE METABOLIC PANEL
ALT: 64 U/L — AB (ref 0–35)
AST: 38 U/L — ABNORMAL HIGH (ref 0–37)
Albumin: 4.7 g/dL (ref 3.5–5.2)
Alkaline Phosphatase: 134 U/L — ABNORMAL HIGH (ref 39–117)
BILIRUBIN TOTAL: 0.3 mg/dL (ref 0.2–1.2)
BUN: 14 mg/dL (ref 6–23)
CO2: 29 meq/L (ref 19–32)
Calcium: 9.7 mg/dL (ref 8.4–10.5)
Chloride: 102 mEq/L (ref 96–112)
Creatinine, Ser: 0.74 mg/dL (ref 0.40–1.20)
GFR: 103.03 mL/min (ref >60.00)
GLUCOSE: 138 mg/dL — AB (ref 70–99)
Potassium: 3.9 mEq/L (ref 3.5–5.1)
SODIUM: 138 meq/L (ref 135–145)
TOTAL PROTEIN: 8.2 g/dL (ref 6.0–8.3)

## 2016-02-03 LAB — CBC
HEMATOCRIT: 40.6 % (ref 36.0–46.0)
HEMOGLOBIN: 13.1 g/dL (ref 12.0–15.0)
MCHC: 32.2 g/dL (ref 30.0–36.0)
MCV: 71.2 fl — ABNORMAL LOW (ref 78.0–100.0)
PLATELETS: 266 10*3/uL (ref 150.0–400.0)
RBC: 5.7 Mil/uL — ABNORMAL HIGH (ref 3.87–5.11)
RDW: 14.6 % (ref 11.5–15.5)
WBC: 5.5 10*3/uL (ref 4.0–10.5)

## 2016-02-03 LAB — LIPID PANEL
CHOL/HDL RATIO: 3
Cholesterol: 228 mg/dL — ABNORMAL HIGH (ref 0–200)
HDL: 84.3 mg/dL (ref >39.00)
LDL Cholesterol: 128 mg/dL — ABNORMAL HIGH (ref 0–99)
NONHDL: 143.85
Triglycerides: 77 mg/dL (ref 0.0–149.0)
VLDL: 15.4 mg/dL (ref 0.0–40.0)

## 2016-02-03 LAB — HEMOGLOBIN A1C: Hgb A1c MFr Bld: 8.2 % — ABNORMAL HIGH (ref 4.6–6.5)

## 2016-02-03 LAB — HEPATITIS C ANTIBODY: HCV AB: NEGATIVE

## 2016-02-03 LAB — HIV ANTIBODY (ROUTINE TESTING W REFLEX): HIV 1&2 Ab, 4th Generation: NONREACTIVE

## 2016-02-03 NOTE — Assessment & Plan Note (Signed)
Checking lipid panel and adjust lovastatin as needed.  

## 2016-02-03 NOTE — Progress Notes (Signed)
Pre visit review using our clinic review tool, if applicable. No additional management support is needed unless otherwise documented below in the visit note. 

## 2016-02-03 NOTE — Progress Notes (Signed)
   Subjective:    Patient ID: Winona Musgrave, female    DOB: 03/12/1955, 61 y.o.   MRN: [id number]  HPI The patient is a 61 YO female coming in for wellness. No new concerns.   [facility] reviewed and updated.   Review of Systems  Constitutional: Negative.   HENT: Negative.   Eyes: Negative.   Respiratory: Negative for cough, chest tightness and shortness of breath.   Cardiovascular: Negative for chest pain, palpitations and leg swelling.  Gastrointestinal: Negative for abdominal distention, abdominal pain, constipation, diarrhea, nausea and vomiting.  Musculoskeletal: Negative.   Skin: Negative.   Neurological: Negative.   Psychiatric/Behavioral: Negative.       Objective:   Physical Exam  Constitutional: She is oriented to person, place, and time. She appears well-developed and well-nourished.  HENT:  Head: Normocephalic and atraumatic.  Eyes: EOM are normal.  Neck: Normal range of motion.  Cardiovascular: Normal rate and regular rhythm.   Pulmonary/Chest: Effort normal and breath sounds normal. No respiratory distress. She has no wheezes. She has no rales.  Abdominal: Soft. Bowel sounds are normal. She exhibits no distension. There is no tenderness. There is no rebound.  Musculoskeletal: She exhibits no edema.  Neurological: She is alert and oriented to person, place, and time. Coordination normal.  Skin: Skin is warm and dry.  Psychiatric: She has a normal mood and affect.   Vitals:   02/03/16 1006  BP: 130/80  Pulse: 74  Resp: 12  Temp: 97.5 F (36.4 C)  TempSrc: Oral  SpO2: 96%  Weight: 147 lb (66.7 kg)  Height: 5\' 2"  (1.575 m)      Assessment & Plan:  Flu shot given and PPD placed

## 2016-02-03 NOTE — Patient Instructions (Signed)
We have sent in the refills and will check the blood work today.   We are also giving you the flu shot today. We have placed the TB test and this needs to be checked in 2-3 days. Bring your form when you come that day to be filled out.   Health Maintenance, Female Introduction Adopting a healthy lifestyle and getting preventive care can go a long way to promote health and wellness. Talk with your health care provider about what schedule of regular examinations is right for you. This is a good chance for you to check in with your provider about disease prevention and staying healthy. In between checkups, there are plenty of things you can do on your own. Experts have done a lot of research about which lifestyle changes and preventive measures are most likely to keep you healthy. Ask your health care provider for more information. Weight and diet Eat a healthy diet  Be sure to include plenty of vegetables, fruits, low-fat dairy products, and lean protein.  Do not eat a lot of foods high in solid fats, added sugars, or salt.  Get regular exercise. This is one of the most important things you can do for your health.  Most adults should exercise for at least 150 minutes each week. The exercise should increase your heart rate and make you sweat (moderate-intensity exercise).  Most adults should also do strengthening exercises at least twice a week. This is in addition to the moderate-intensity exercise. Maintain a healthy weight  Body mass index (BMI) is a measurement that can be used to identify possible weight problems. It estimates body fat based on height and weight. Your health care provider can help determine your BMI and help you achieve or maintain a healthy weight.  For females 93 years of age and older:  A BMI below 18.5 is considered underweight.  A BMI of 18.5 to 24.9 is normal.  A BMI of 25 to 29.9 is considered overweight.  A BMI of 30 and above is considered obese. Watch  levels of cholesterol and blood lipids  You should start having your blood tested for lipids and cholesterol at 60 years of age, then have this test every 5 years.  You may need to have your cholesterol levels checked more often if:  Your lipid or cholesterol levels are high.  You are older than 60 years of age.  You are at high risk for heart disease. Cancer screening Lung Cancer  Lung cancer screening is recommended for adults 53-77 years old who are at high risk for lung cancer because of a history of smoking.  A yearly low-dose CT scan of the lungs is recommended for people who:  Currently smoke.  Have quit within the past 15 years.  Have at least a 30-pack-year history of smoking. A pack year is smoking an average of one pack of cigarettes a day for 1 year.  Yearly screening should continue until it has been 15 years since you quit.  Yearly screening should stop if you develop a health problem that would prevent you from having lung cancer treatment. Breast Cancer  Practice breast self-awareness. This means understanding how your breasts normally appear and feel.  It also means doing regular breast self-exams. Let your health care provider know about any changes, no matter how small.  If you are in your 20s or 30s, you should have a clinical breast exam (CBE) by a health care provider every 1-3 years as part of a  regular health exam.  If you are 40 or older, have a CBE every year. Also consider having a breast X-ray (mammogram) every year.  If you have a family history of breast cancer, talk to your health care provider about genetic screening.  If you are at high risk for breast cancer, talk to your health care provider about having an MRI and a mammogram every year.  Breast cancer gene (BRCA) assessment is recommended for women who have family members with BRCA-related cancers. BRCA-related cancers include:  Breast.  Ovarian.  Tubal.  Peritoneal  cancers.  Results of the assessment will determine the need for genetic counseling and BRCA1 and BRCA2 testing. Cervical Cancer  Your health care provider may recommend that you be screened regularly for cancer of the pelvic organs (ovaries, uterus, and vagina). This screening involves a pelvic examination, including checking for microscopic changes to the surface of your cervix (Pap test). You may be encouraged to have this screening done every 3 years, beginning at age 25.  For women ages 74-65, health care providers may recommend pelvic exams and Pap testing every 3 years, or they may recommend the Pap and pelvic exam, combined with testing for human papilloma virus (HPV), every 5 years. Some types of HPV increase your risk of cervical cancer. Testing for HPV may also be done on women of any age with unclear Pap test results.  Other health care providers may not recommend any screening for nonpregnant women who are considered low risk for pelvic cancer and who do not have symptoms. Ask your health care provider if a screening pelvic exam is right for you.  If you have had past treatment for cervical cancer or a condition that could lead to cancer, you need Pap tests and screening for cancer for at least 20 years after your treatment. If Pap tests have been discontinued, your risk factors (such as having a new sexual partner) need to be reassessed to determine if screening should resume. Some women have medical problems that increase the chance of getting cervical cancer. In these cases, your health care provider may recommend more frequent screening and Pap tests. Colorectal Cancer  This type of cancer can be detected and often prevented.  Routine colorectal cancer screening usually begins at 60 years of age and continues through 60 years of age.  Your health care provider may recommend screening at an earlier age if you have risk factors for colon cancer.  Your health care provider may also  recommend using home test kits to check for hidden blood in the stool.  A small camera at the end of a tube can be used to examine your colon directly (sigmoidoscopy or colonoscopy). This is done to check for the earliest forms of colorectal cancer.  Routine screening usually begins at age 69.  Direct examination of the colon should be repeated every 5-10 years through 60 years of age. However, you may need to be screened more often if early forms of precancerous polyps or small growths are found. Skin Cancer  Check your skin from head to toe regularly.  Tell your health care provider about any new moles or changes in moles, especially if there is a change in a mole's shape or color.  Also tell your health care provider if you have a mole that is larger than the size of a pencil eraser.  Always use sunscreen. Apply sunscreen liberally and repeatedly throughout the day.  Protect yourself by wearing long sleeves, pants, a wide-brimmed  hat, and sunglasses whenever you are outside. Heart disease, diabetes, and high blood pressure  High blood pressure causes heart disease and increases the risk of stroke. High blood pressure is more likely to develop in:  People who have blood pressure in the high end of the normal range (130-139/85-89 mm Hg).  People who are overweight or obese.  People who are African American.  If you are 25-70 years of age, have your blood pressure checked every 3-5 years. If you are 48 years of age or older, have your blood pressure checked every year. You should have your blood pressure measured twice-once when you are at a hospital or clinic, and once when you are not at a hospital or clinic. Record the average of the two measurements. To check your blood pressure when you are not at a hospital or clinic, you can use:  An automated blood pressure machine at a pharmacy.  A home blood pressure monitor.  If you are between 19 years and 2 years old, ask your health  care provider if you should take aspirin to prevent strokes.  Have regular diabetes screenings. This involves taking a blood sample to check your fasting blood sugar level.  If you are at a normal weight and have a low risk for diabetes, have this test once every three years after 60 years of age.  If you are overweight and have a high risk for diabetes, consider being tested at a younger age or more often. Preventing infection Hepatitis B  If you have a higher risk for hepatitis B, you should be screened for this virus. You are considered at high risk for hepatitis B if:  You were born in a country where hepatitis B is common. Ask your health care provider which countries are considered high risk.  Your parents were born in a high-risk country, and you have not been immunized against hepatitis B (hepatitis B vaccine).  You have HIV or AIDS.  You use needles to inject street drugs.  You live with someone who has hepatitis B.  You have had sex with someone who has hepatitis B.  You get hemodialysis treatment.  You take certain medicines for conditions, including cancer, organ transplantation, and autoimmune conditions. Hepatitis C  Blood testing is recommended for:  Everyone born from [birth year] through 1964.  Anyone with known risk factors for hepatitis C. Sexually transmitted infections (STIs)  You should be screened for sexually transmitted infections (STIs) including gonorrhea and chlamydia if:  You are sexually active and are younger than 60 years of age.  You are older than 60 years of age and your health care provider tells you that you are at risk for this type of infection.  Your sexual activity has changed since you were last screened and you are at an increased risk for chlamydia or gonorrhea. Ask your health care provider if you are at risk.  If you do not have HIV, but are at risk, it may be recommended that you take a prescription medicine daily to prevent HIV  infection. This is called pre-exposure prophylaxis (PrEP). You are considered at risk if:  You are sexually active and do not regularly use condoms or know the HIV status of your partner(s).  You take drugs by injection.  You are sexually active with a partner who has HIV. Talk with your health care provider about whether you are at high risk of being infected with HIV. If you choose to begin PrEP, you should first  be tested for HIV. You should then be tested every 3 months for as long as you are taking PrEP. Pregnancy  If you are premenopausal and you may become pregnant, ask your health care provider about preconception counseling.  If you may become pregnant, take 400 to 800 micrograms (mcg) of folic acid every day.  If you want to prevent pregnancy, talk to your health care provider about birth control (contraception). Osteoporosis and menopause  Osteoporosis is a disease in which the bones lose minerals and strength with aging. This can result in serious bone fractures. Your risk for osteoporosis can be identified using a bone density scan.  If you are 29 years of age or older, or if you are at risk for osteoporosis and fractures, ask your health care provider if you should be screened.  Ask your health care provider whether you should take a calcium or vitamin D supplement to lower your risk for osteoporosis.  Menopause may have certain physical symptoms and risks.  Hormone replacement therapy may reduce some of these symptoms and risks. Talk to your health care provider about whether hormone replacement therapy is right for you. Follow these instructions at home:  Schedule regular health, dental, and eye exams.  Stay current with your immunizations.  Do not use any tobacco products including cigarettes, chewing tobacco, or electronic cigarettes.  If you are pregnant, do not drink alcohol.  If you are breastfeeding, limit how much and how often you drink alcohol.  Limit  alcohol intake to no more than 1 drink per day for nonpregnant women. One drink equals 12 ounces of beer, 5 ounces of wine, or 1 ounces of hard liquor.  Do not use street drugs.  Do not share needles.  Ask your health care provider for help if you need support or information about quitting drugs.  Tell your health care provider if you often feel depressed.  Tell your health care provider if you have ever been abused or do not feel safe at home. This information is not intended to replace advice given to you by your health care provider. Make sure you discuss any questions you have with your health care provider. Document Released: 07/21/2010 Document Revised: 06/13/2015 Document Reviewed: 10/09/2014  2017 [employer]

## 2016-02-03 NOTE — Assessment & Plan Note (Signed)
Flu shot given at visit, PPD placed (needed for her work). Declines tdap today. Checking HIV and hep c screening. Counseled about need for exercise and dangers of distracted driving. Given screening recommendations.

## 2016-02-03 NOTE — Assessment & Plan Note (Signed)
BP at goal on her amlodipine. Checking CMP for complications and adjust as needed.

## 2016-02-07 ENCOUNTER — Other Ambulatory Visit: Payer: Self-pay | Admitting: Internal Medicine

## 2016-02-07 MED ORDER — METFORMIN HCL 500 MG PO TABS: 500.0000 mg | ORAL_TABLET | Freq: Two times a day (BID) | ORAL | 3 refills | Status: DC

## 2016-02-07 MED ORDER — LOVASTATIN 40 MG PO TABS: 40.0000 mg | ORAL_TABLET | Freq: Every day | ORAL | 1 refills | Status: DC

## 2016-02-18 ENCOUNTER — Ambulatory Visit (INDEPENDENT_AMBULATORY_CARE_PROVIDER_SITE_OTHER): Payer: BLUE CROSS/BLUE SHIELD | Admitting: General Practice

## 2016-02-18 DIAGNOSIS — Z111 Encounter for screening for respiratory tuberculosis: Secondary | ICD-10-CM | POA: Diagnosis not present

## 2016-02-18 NOTE — Progress Notes (Signed)
Medical treatment/procedure(s) were performed by non-physician practitioner and as supervising physician I was immediately available for consultation/collaboration. I agree with above. Calvin Chura A Tearsa Kowalewski, MD  

## 2016-02-20 ENCOUNTER — Encounter: Payer: Self-pay | Admitting: General Practice

## 2016-02-20 LAB — TB SKIN TEST
INDURATION: 0 mm
TB SKIN TEST: NEGATIVE

## 2016-02-27 ENCOUNTER — Other Ambulatory Visit: Payer: Self-pay | Admitting: *Deleted

## 2016-02-27 MED ORDER — AMLODIPINE BESYLATE 5 MG PO TABS: 5.0000 mg | ORAL_TABLET | Freq: Every day | ORAL | 1 refills | Status: DC

## 2016-02-27 NOTE — Telephone Encounter (Signed)
Rec'd call pt states she is needing refill on her amlodipine. Verified chart had cpx in Jan. Verified pharmacy sent script to [facility]...Raechel Chute/lmb

## 2016-11-23 ENCOUNTER — Other Ambulatory Visit: Payer: Self-pay | Admitting: Internal Medicine

## 2016-12-02 IMAGING — US US BREAST 10 MINUTES
1 series · 7 of 7 positions shown · non-contrast
Comparison: Previous exam(s).

CLINICAL DATA: Screening recall for bilateral breast masses.

EXAM:
2D DIGITAL DIAGNOSTIC BILATERAL MAMMOGRAM WITH CAD AND ADJUNCT TOMO
BILATERAL BREAST ULTRASOUND

[Series 1: us breast 10 minutes · 0.07mm/px · 7 of 7 slices shown]
[im 1/7]
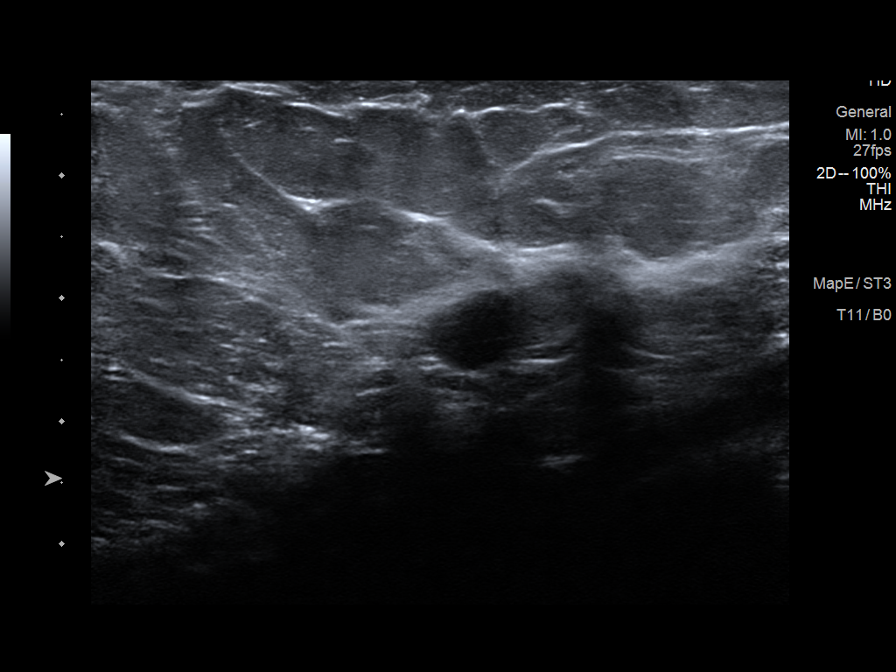
[im 2/7]
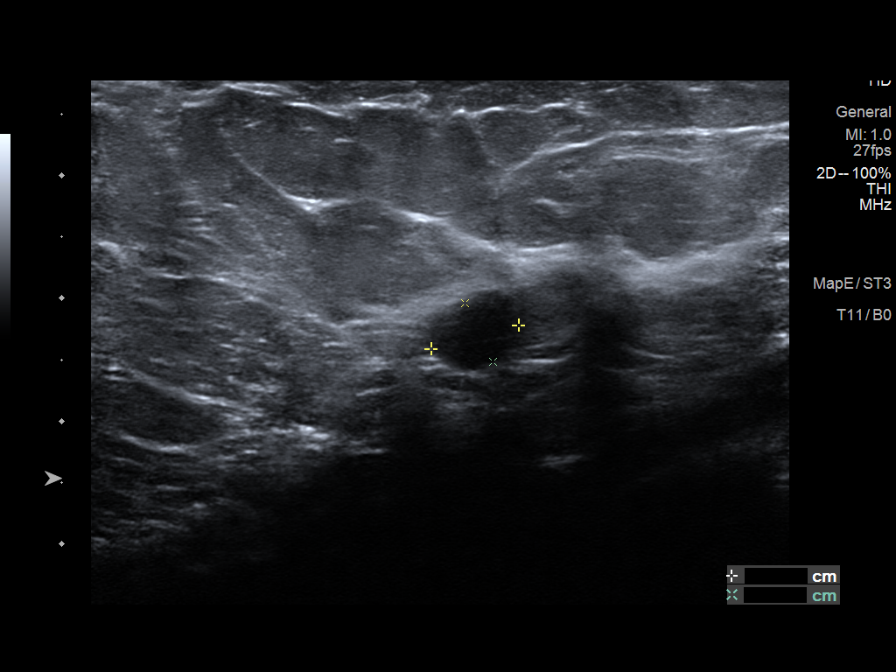
[im 3/7]
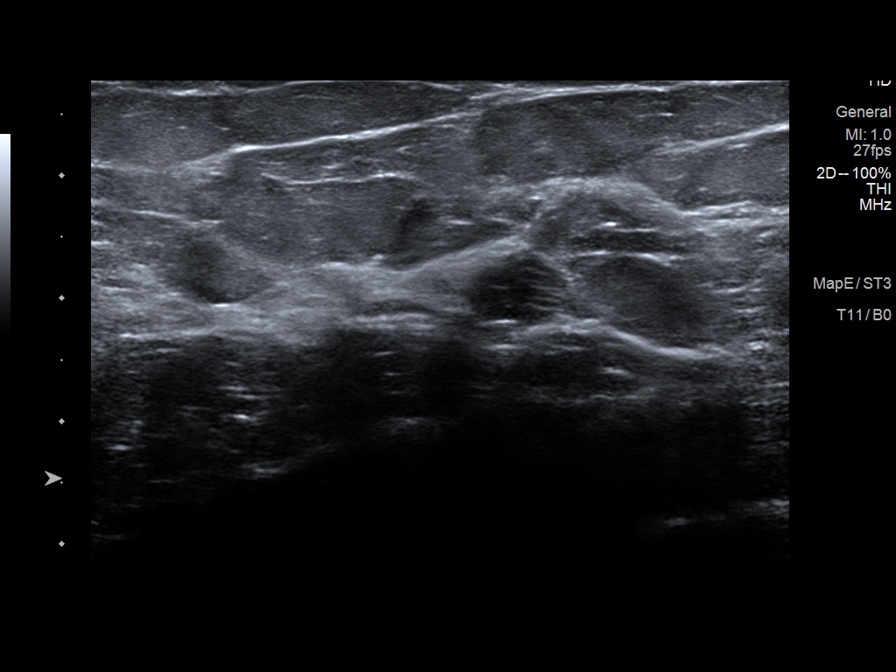
[im 4/7]
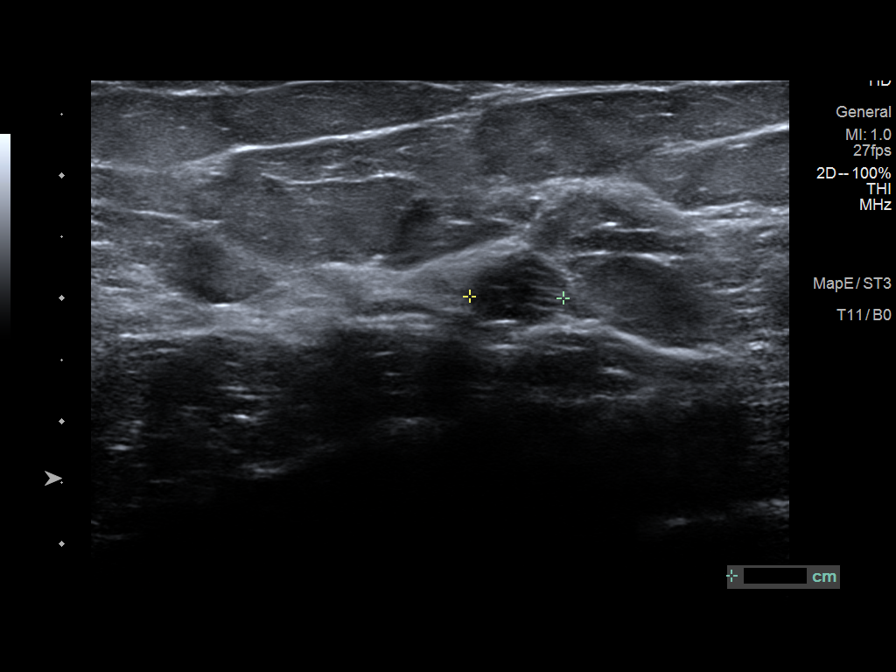
[im 5/7]
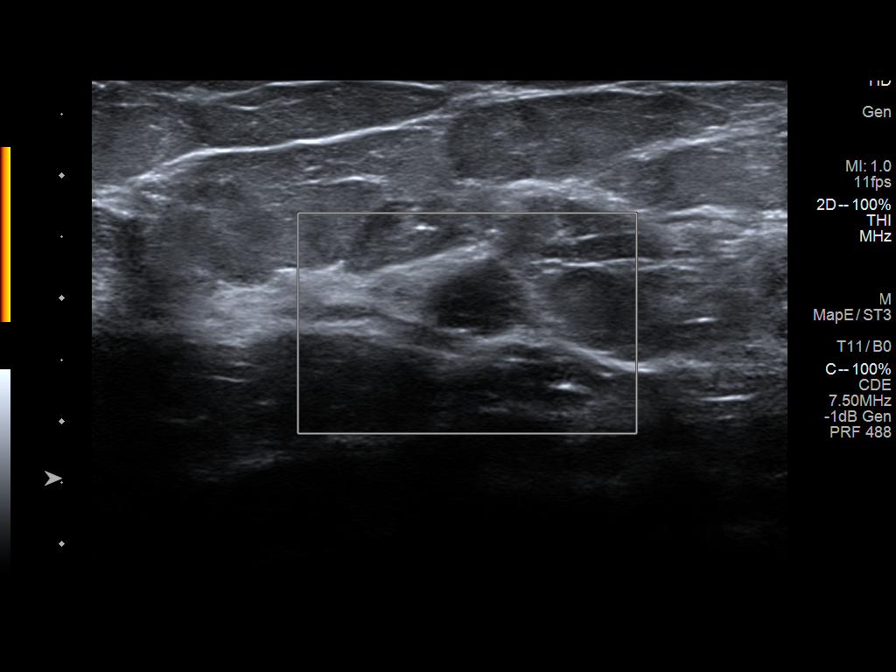
[im 6/7]
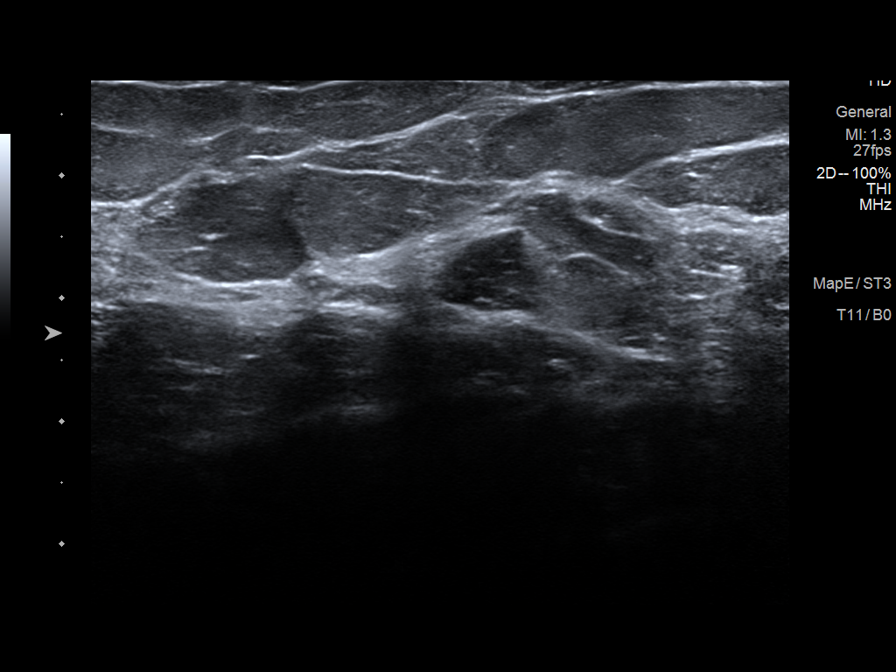
[im 7/7]
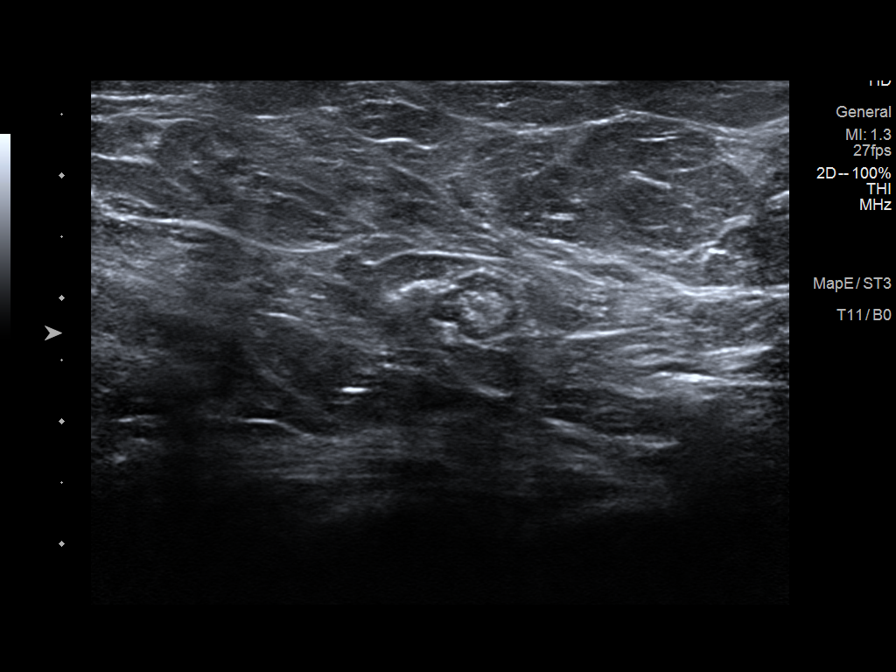

[7 of 7 positions shown; findings below may reference images not displayed]

ACR Breast Density Category b: There are scattered areas of
fibroglandular density.
FINDINGS: Cc and MLO tomosynthesis was performed of the bilateral breasts.
There is an oval mass with obscured margins in the lower left breast
measuring approximately 9 mm. There is an oval circumscribed mass in
the central to upper left breast measuring approximately 9 mm.

Mammographic images were processed with CAD.

Physical examination of the lower right breast does not reveal any
palpable masses. Physical examination of the upper left breast does
not reveal any palpable masses.

Targeted ultrasound of the right breast was performed demonstrating
an oval hypoechoic mass with slightly irregular margins at 6 o'clock
4 cm from the nipple measuring 0.7 x 0.5 x 0.8 cm. This corresponds
with mammography findings. This may represent a cluster of cysts
however cannot be clearly characterized as such. No lymphadenopathy
seen in the right axilla.

Targeted ultrasound of the left breast was performed demonstrating a
cluster of cysts at 12 o'clock 3 cm from nipple measuring 0.7 x
x 0.8 cm. This corresponds well with the mass seen at mammography.
IMPRESSION: 1. Indeterminate right breast mass at 6 o'clock 4 cm from the
nipple.

2.  Cluster of cysts in the upper left breast.

RECOMMENDATION:
Ultrasound-guided biopsy of the mass in the right breast at 6
o'clock is recommended. This is being scheduled for the patient.

I have discussed the findings and recommendations with the patient.
Results were also provided in writing at the conclusion of the
visit. If applicable, a reminder letter will be sent to the patient
regarding the next appointment.

BI-RADS CATEGORY  Category 4A: Low suspicion for malignancy.

## 2016-12-17 IMAGING — MG MM DIGITAL DIAGNOSTIC UNILAT R
2 series · 2 of 2 positions shown · non-contrast
Comparison: Previous exam(s).

CLINICAL DATA: Status post ultrasound-guided core biopsy of a mass
in the 6 o'clock region the right breast. A biopsy clip was not
deployed secondary to not being able to see the lesion following
tissue sampling.

EXAM:
DIAGNOSTIC RIGHT MAMMOGRAM POST ULTRASOUND BIOPSY

[R MLO]
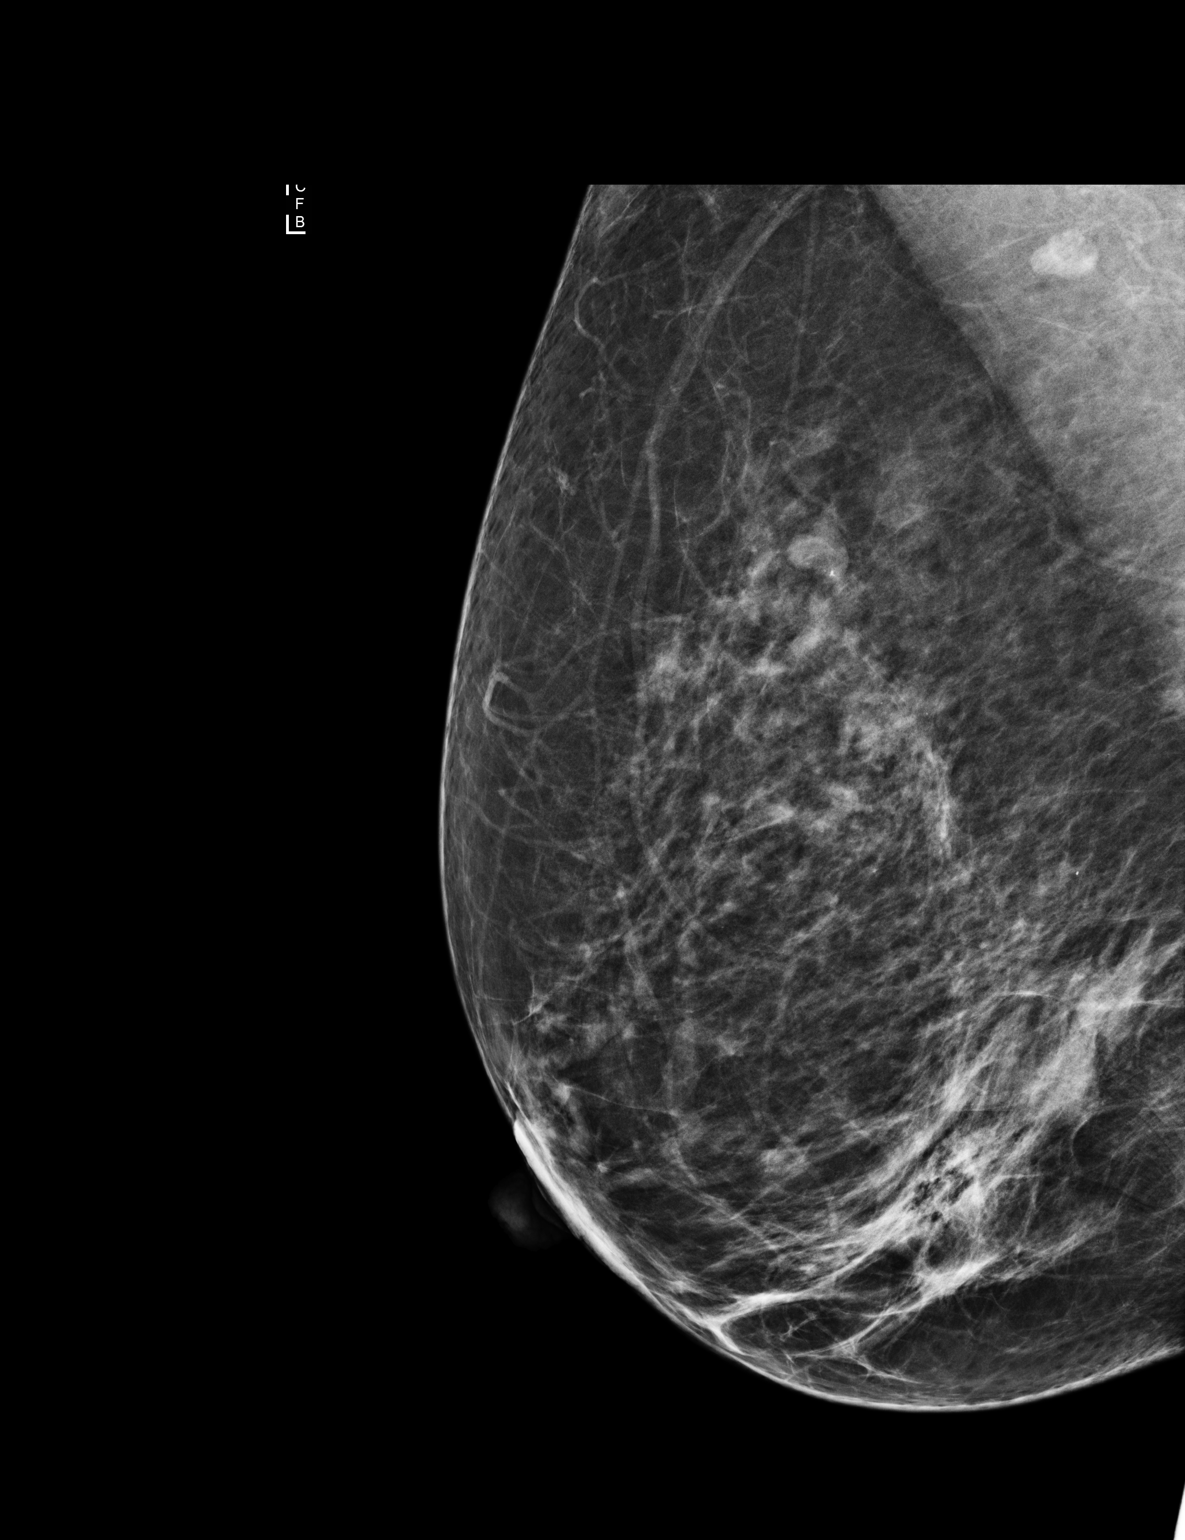

[R CC]
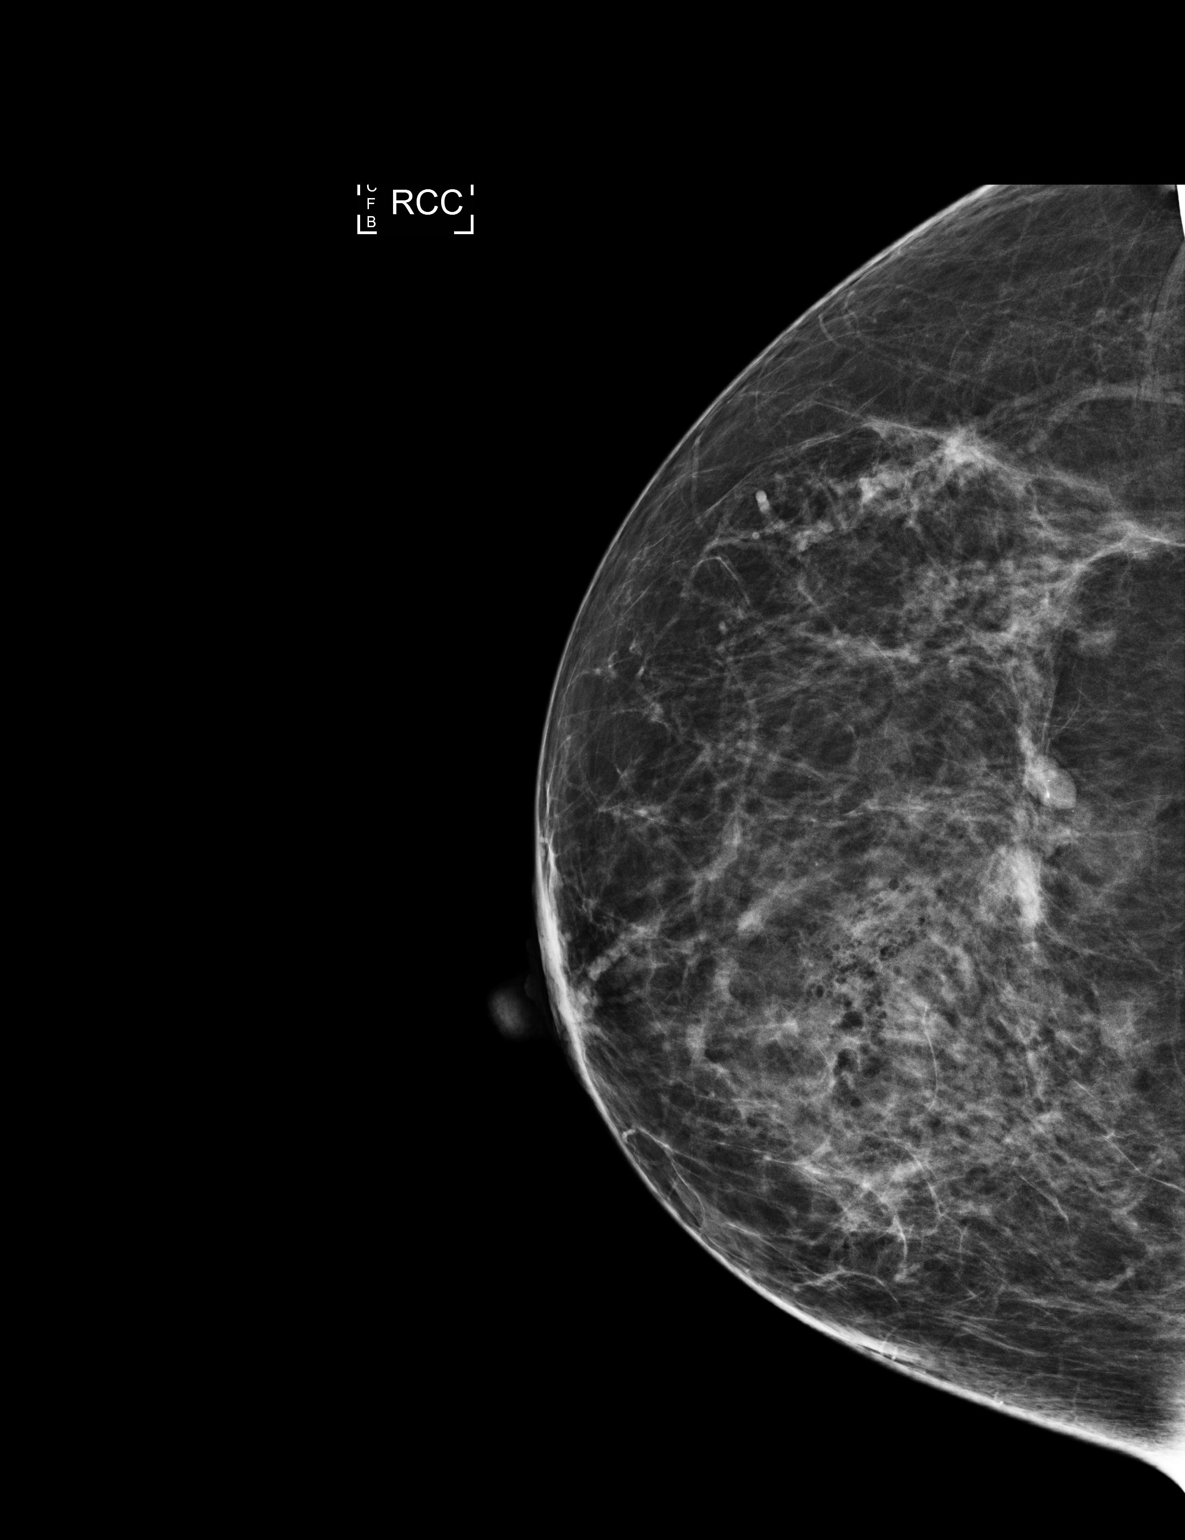

[2 of 2 positions shown; findings below may reference images not displayed]

FINDINGS: Mammographic images were obtained following ultrasound guided biopsy
of a lesion in the 6 o'clock region of the right breast. After 2
tissue samples were obtained the lesion in the 6 o'clock region was
no longer visible therefore it is thought the lesion was cysts that
decompressed. Mammographic images show biopsy changes in the area of
concern on the mammogram.
IMPRESSION: Status post ultrasound-guided core biopsy of the right breast.
Correlation with pathologic results recommended. If the pathology is
benign I would recommend a short-term interval follow-up right
mammogram and ultrasound.

Final Assessment: Post Procedure Mammograms for Marker Placement

## 2016-12-17 IMAGING — US US RT BREAST BX
1 series · 9 of 9 positions shown · non-contrast
Comparison: Previous exam(s).

ADDENDUM:
Pathology revealed FIBROCYSTIC CHANGES WITH USUAL DUCTAL HYPERPLASIA
of the Right breast at the 6:00 o'clock location. This was found to
be concordant by Dr. Dhdj Edward. Pathology results were discussed
with the patient by telephone. The patient reported doing well after
the biopsy with tenderness and bruising at the site. Post biopsy
instructions and care were reviewed and questions were answered. The
patient was encouraged to call [REDACTED] for any additional concerns. The patient was asked to return
for right diagnostic mammography and ultrasound in 6 months and
informed a reminder notice would be sent regarding this appointment.

Pathology results reported by Kristfinnur Alajraf, RN on 06/18/2015.
CLINICAL DATA: Right breast mass.
EXAM:
ULTRASOUND GUIDED RIGHT BREAST CORE NEEDLE BIOPSY

[Series 1: us right breast bx · 0.07mm/px · 9 of 9 slices shown]
[im 1/9]
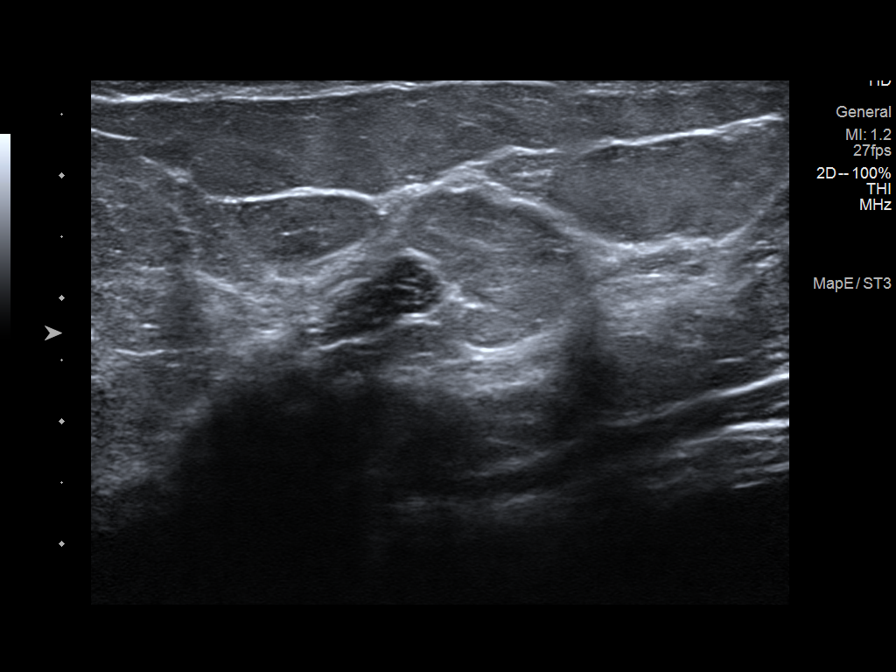
[im 2/9]
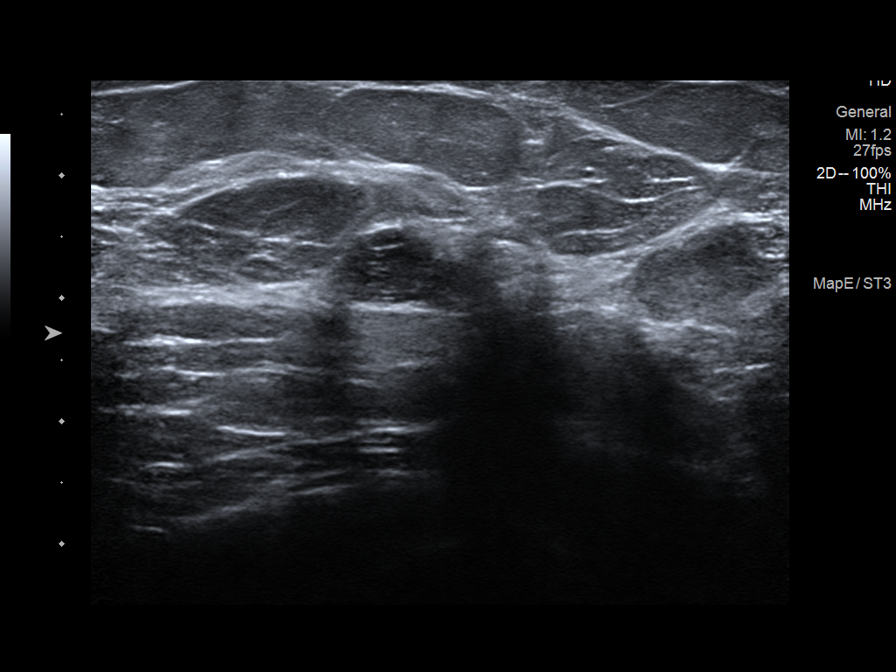
[im 3/9]
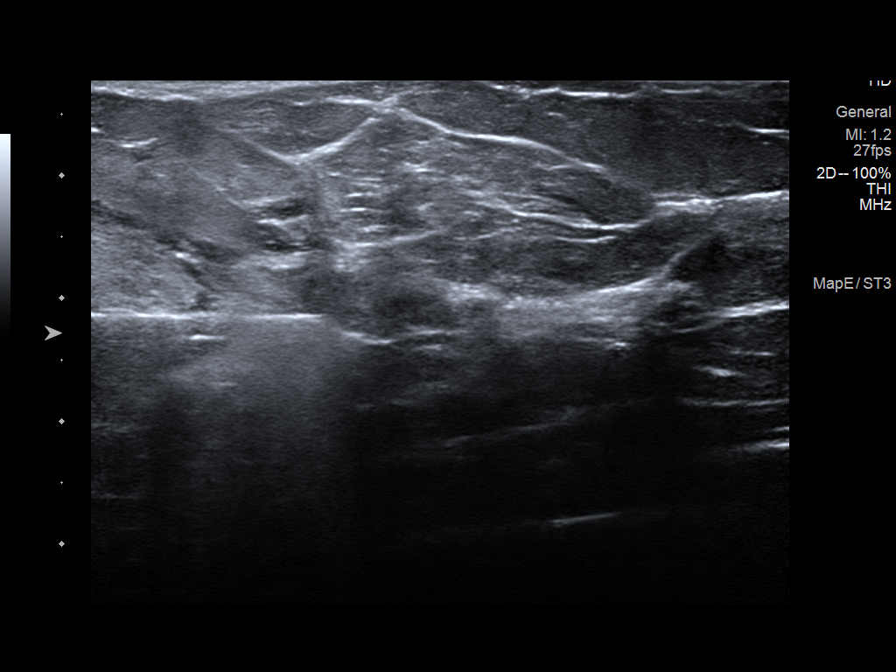
[im 4/9]
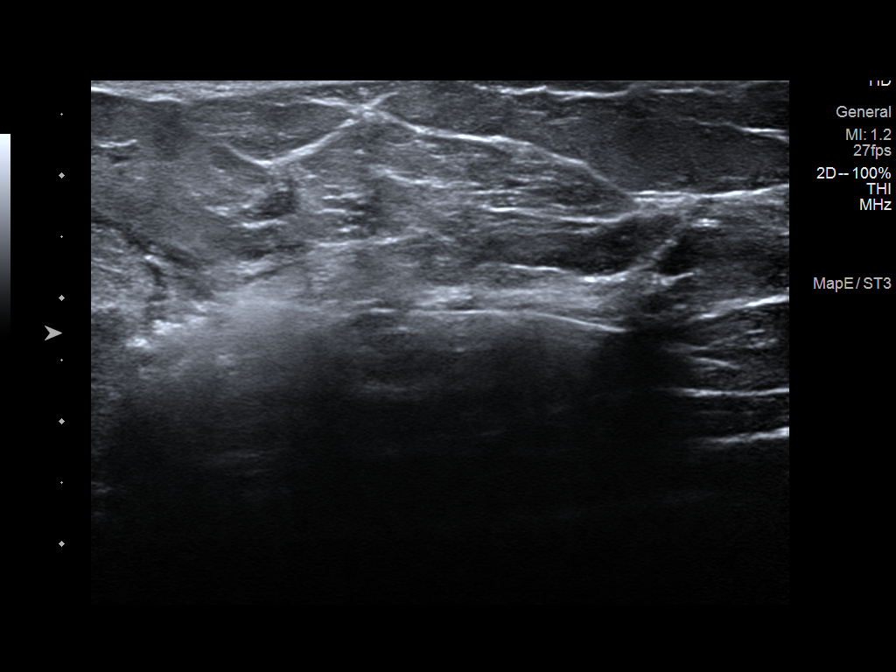
[im 5/9]
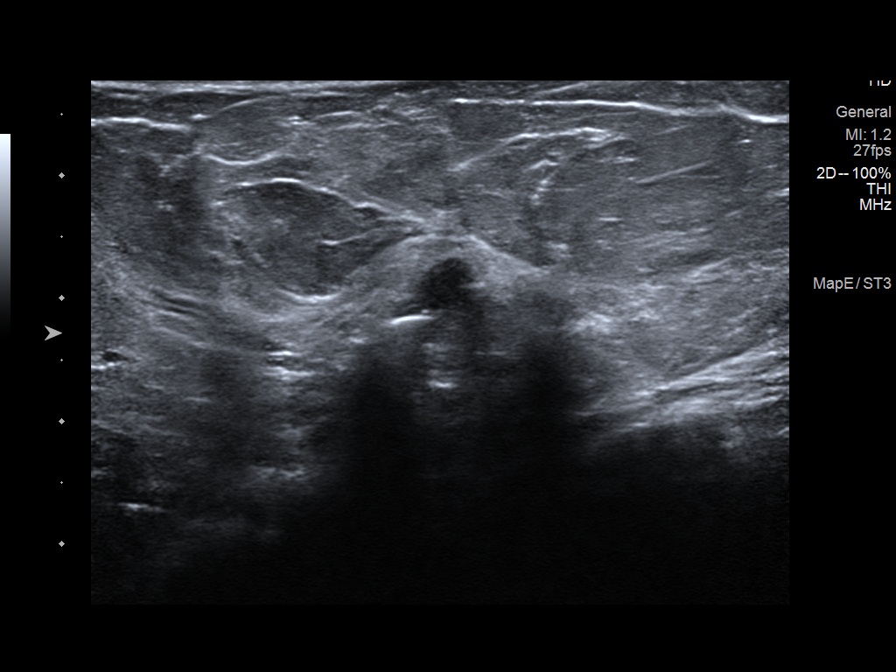
[im 6/9]
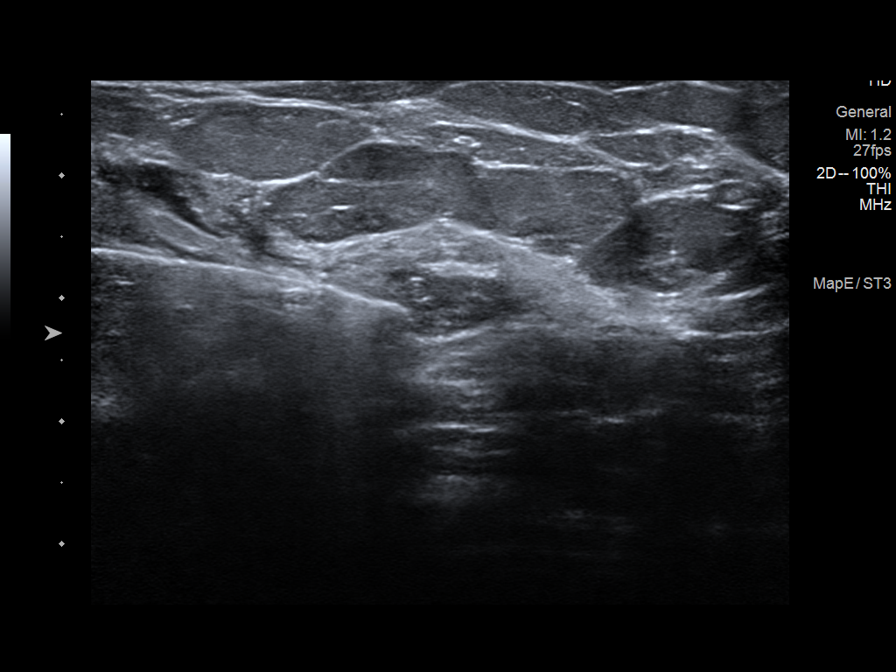
[im 7/9]
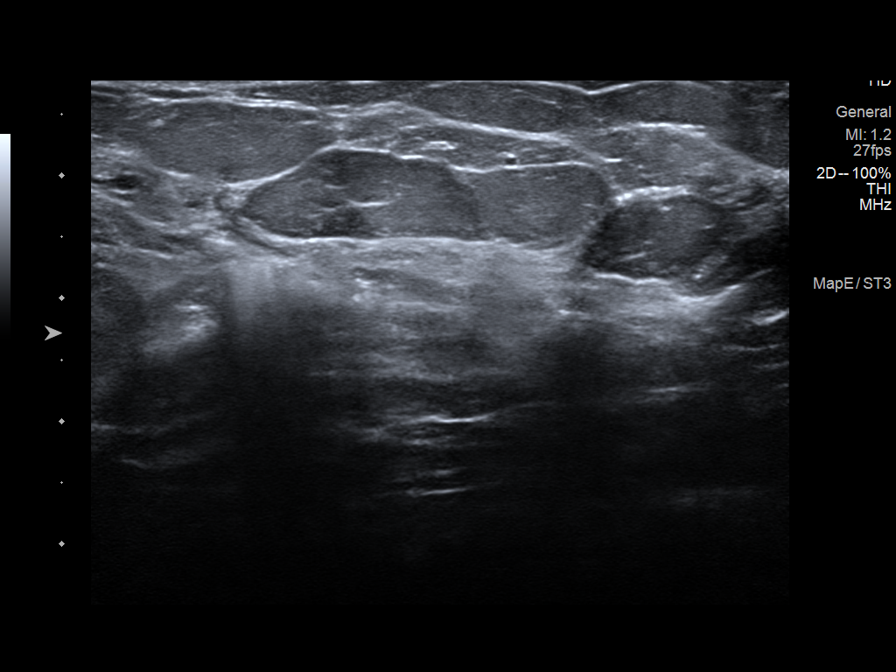
[im 8/9]
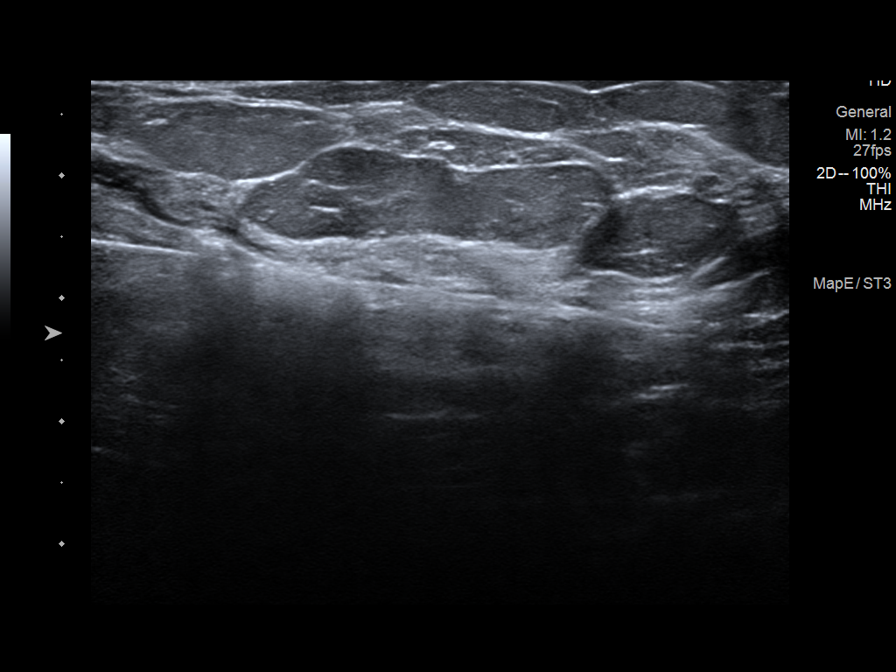
[im 9/9]
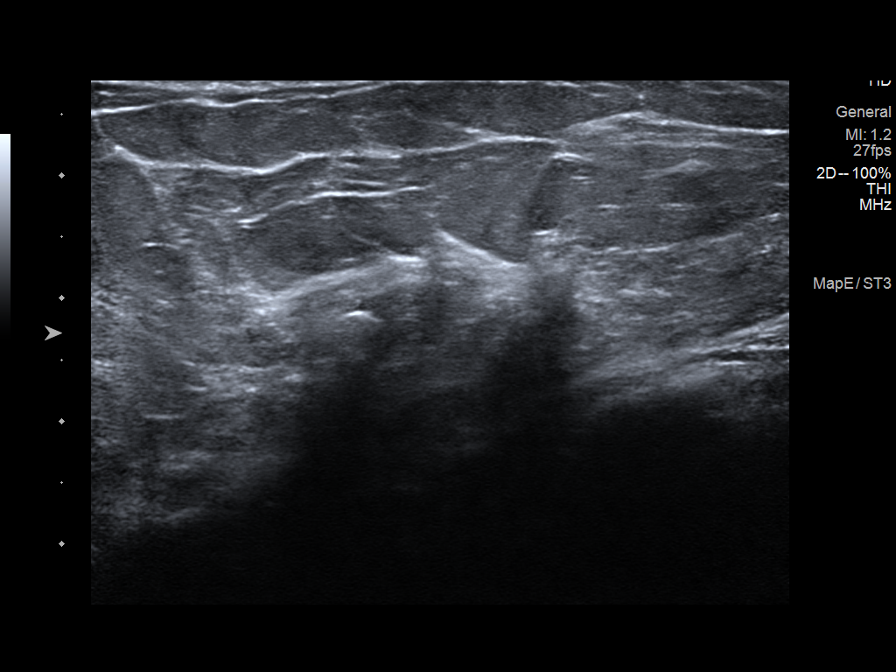

[9 of 9 positions shown; findings below may reference images not displayed]

PROCEDURE:
I met with the patient and we discussed the procedure of
ultrasound-guided biopsy, including benefits and alternatives. We
discussed the high likelihood of a successful procedure. We
discussed the risks of the procedure including infection, bleeding,
tissue injury, clip migration, and inadequate sampling. Informed
written consent was given. The usual time-out protocol was performed
immediately prior to the procedure.

Using sterile technique and 1% Lidocaine as local anesthetic, under
direct ultrasound visualization, a 12 gauge vacuum-assisted device
was used to perform biopsy of a mass in the 6 o'clock region of the
right breastusing a medial to lateral approach. After 2 tissue
samples were obtained the lesion in the 6 o'clock region the right
breast was no longer visible. The lesion is thought to likely be
cysts that decompressed. A tissue marker was not left secondary to
not being able to see the lesion. Two-view mammogram was obtained in
will be dictated separately.
IMPRESSION: Ultrasound-guided biopsy of the right breast. No apparent
complications.

## 2017-01-05 ENCOUNTER — Other Ambulatory Visit: Payer: Self-pay | Admitting: Internal Medicine

## 2017-01-11 ENCOUNTER — Encounter: Payer: BLUE CROSS/BLUE SHIELD | Admitting: Internal Medicine

## 2017-01-14 ENCOUNTER — Ambulatory Visit (INDEPENDENT_AMBULATORY_CARE_PROVIDER_SITE_OTHER): Payer: 59 | Admitting: Internal Medicine

## 2017-01-14 NOTE — Progress Notes (Signed)
ERROR

## 2017-02-08 ENCOUNTER — Other Ambulatory Visit (INDEPENDENT_AMBULATORY_CARE_PROVIDER_SITE_OTHER): Payer: 59

## 2017-02-08 ENCOUNTER — Encounter: Payer: Self-pay | Admitting: Internal Medicine

## 2017-02-08 ENCOUNTER — Ambulatory Visit (INDEPENDENT_AMBULATORY_CARE_PROVIDER_SITE_OTHER): Payer: 59 | Admitting: Internal Medicine

## 2017-02-08 VITALS — BP 122/76 | HR 78 | Temp 97.9°F | Ht 62.0 in | Wt 149.0 lb

## 2017-02-08 DIAGNOSIS — Z23 Encounter for immunization: Secondary | ICD-10-CM

## 2017-02-08 DIAGNOSIS — E119 Type 2 diabetes mellitus without complications: Secondary | ICD-10-CM

## 2017-02-08 DIAGNOSIS — G8929 Other chronic pain: Secondary | ICD-10-CM

## 2017-02-08 DIAGNOSIS — M25562 Pain in left knee: Secondary | ICD-10-CM

## 2017-02-08 DIAGNOSIS — E1169 Type 2 diabetes mellitus with other specified complication: Secondary | ICD-10-CM

## 2017-02-08 DIAGNOSIS — Z0000 Encounter for general adult medical examination without abnormal findings: Secondary | ICD-10-CM

## 2017-02-08 DIAGNOSIS — E785 Hyperlipidemia, unspecified: Secondary | ICD-10-CM | POA: Diagnosis not present

## 2017-02-08 DIAGNOSIS — M25561 Pain in right knee: Secondary | ICD-10-CM | POA: Diagnosis not present

## 2017-02-08 DIAGNOSIS — I10 Essential (primary) hypertension: Secondary | ICD-10-CM

## 2017-02-08 LAB — COMPREHENSIVE METABOLIC PANEL
ALBUMIN: 4.4 g/dL (ref 3.5–5.2)
ALT: 20 U/L (ref 0–35)
AST: 14 U/L (ref 0–37)
Alkaline Phosphatase: 70 U/L (ref 39–117)
BUN: 14 mg/dL (ref 6–23)
CO2: 32 mEq/L (ref 19–32)
CREATININE: 0.71 mg/dL (ref 0.40–1.20)
Calcium: 9.5 mg/dL (ref 8.4–10.5)
Chloride: 102 mEq/L (ref 96–112)
GFR: 107.7 mL/min (ref >60.00)
GLUCOSE: 153 mg/dL — AB (ref 70–99)
POTASSIUM: 3.8 meq/L (ref 3.5–5.1)
SODIUM: 139 meq/L (ref 135–145)
TOTAL PROTEIN: 7.2 g/dL (ref 6.0–8.3)
Total Bilirubin: 0.4 mg/dL (ref 0.2–1.2)

## 2017-02-08 LAB — CBC
HEMATOCRIT: 38.8 % (ref 36.0–46.0)
Hemoglobin: 12.1 g/dL (ref 12.0–15.0)
MCHC: 31.1 g/dL (ref 30.0–36.0)
MCV: 72.8 fl — ABNORMAL LOW (ref 78.0–100.0)
Platelets: 272 10*3/uL (ref 150.0–400.0)
RBC: 5.33 Mil/uL — AB (ref 3.87–5.11)
RDW: 14.5 % (ref 11.5–15.5)
WBC: 4.6 10*3/uL (ref 4.0–10.5)

## 2017-02-08 LAB — LIPID PANEL
CHOLESTEROL: 183 mg/dL (ref 0–200)
HDL: 59 mg/dL (ref >39.00)
LDL Cholesterol: 110 mg/dL — ABNORMAL HIGH (ref 0–99)
NonHDL: 123.75
TRIGLYCERIDES: 70 mg/dL (ref 0.0–149.0)
Total CHOL/HDL Ratio: 3
VLDL: 14 mg/dL (ref 0.0–40.0)

## 2017-02-08 LAB — HEMOGLOBIN A1C: HEMOGLOBIN A1C: 8.5 % — AB (ref 4.6–6.5)

## 2017-02-08 MED ORDER — METFORMIN HCL ER (MOD) 1000 MG PO TB24: 1000.0000 mg | ORAL_TABLET | Freq: Every day | ORAL | 3 refills | Status: DC

## 2017-02-08 MED ORDER — DICLOFENAC SODIUM 1 % TD GEL: 2.0000 g | Freq: Four times a day (QID) | TRANSDERMAL | 6 refills | Status: DC

## 2017-02-08 NOTE — Assessment & Plan Note (Signed)
Rx for voltaren gel today.

## 2017-02-08 NOTE — Assessment & Plan Note (Signed)
Taking lovastatin daily, checking lipid panel and adjust as needed for LDL <100.

## 2017-02-08 NOTE — Assessment & Plan Note (Signed)
Uses [facility] for her eye exam which is current. Foot exam done today. Checking HgA1c and adjust as needed. She prefers long acting metformin so converted today. Increased to 1000 metformin ER. Not known to be complicated.

## 2017-02-08 NOTE — Patient Instructions (Signed)
We will check the labs today and get you in with a nutritionist. We have put you on the shingles waiting list.   Health Maintenance, Female Adopting a healthy lifestyle and getting preventive care can go a long way to promote health and wellness. Talk with your health care provider about what schedule of regular examinations is right for you. This is a good chance for you to check in with your provider about disease prevention and staying healthy. In between checkups, there are plenty of things you can do on your own. Experts have done a lot of research about which lifestyle changes and preventive measures are most likely to keep you healthy. Ask your health care provider for more information. Weight and diet Eat a healthy diet  Be sure to include plenty of vegetables, fruits, low-fat dairy products, and lean protein.  Do not eat a lot of foods high in solid fats, added sugars, or salt.  Get regular exercise. This is one of the most important things you can do for your health. ? Most adults should exercise for at least 150 minutes each week. The exercise should increase your heart rate and make you sweat (moderate-intensity exercise). ? Most adults should also do strengthening exercises at least twice a week. This is in addition to the moderate-intensity exercise.  Maintain a healthy weight  Body mass index (BMI) is a measurement that can be used to identify possible weight problems. It estimates body fat based on height and weight. Your health care provider can help determine your BMI and help you achieve or maintain a healthy weight.  For females 23 years of age and older: ? A BMI below 18.5 is considered underweight. ? A BMI of 18.5 to 24.9 is normal. ? A BMI of 25 to 29.9 is considered overweight. ? A BMI of 30 and above is considered obese.  Watch levels of cholesterol and blood lipids  You should start having your blood tested for lipids and cholesterol at 61 years of age, then have  this test every 5 years.  You may need to have your cholesterol levels checked more often if: ? Your lipid or cholesterol levels are high. ? You are older than 61 years of age. ? You are at high risk for heart disease.  Cancer screening Lung Cancer  Lung cancer screening is recommended for adults 13-37 years old who are at high risk for lung cancer because of a history of smoking.  A yearly low-dose CT scan of the lungs is recommended for people who: ? Currently smoke. ? Have quit within the past 15 years. ? Have at least a 30-pack-year history of smoking. A pack year is smoking an average of one pack of cigarettes a day for 1 year.  Yearly screening should continue until it has been 15 years since you quit.  Yearly screening should stop if you develop a health problem that would prevent you from having lung cancer treatment.  Breast Cancer  Practice breast self-awareness. This means understanding how your breasts normally appear and feel.  It also means doing regular breast self-exams. Let your health care provider know about any changes, no matter how small.  If you are in your 20s or 30s, you should have a clinical breast exam (CBE) by a health care provider every 1-3 years as part of a regular health exam.  If you are 70 or older, have a CBE every year. Also consider having a breast X-ray (mammogram) every year.  If  you have a family history of breast cancer, talk to your health care provider about genetic screening.  If you are at high risk for breast cancer, talk to your health care provider about having an MRI and a mammogram every year.  Breast cancer gene (BRCA) assessment is recommended for women who have family members with BRCA-related cancers. BRCA-related cancers include: ? Breast. ? Ovarian. ? Tubal. ? Peritoneal cancers.  Results of the assessment will determine the need for genetic counseling and BRCA1 and BRCA2 testing.  Cervical Cancer Your health care  provider may recommend that you be screened regularly for cancer of the pelvic organs (ovaries, uterus, and vagina). This screening involves a pelvic examination, including checking for microscopic changes to the surface of your cervix (Pap test). You may be encouraged to have this screening done every 3 years, beginning at age 60.  For women ages 28-65, health care providers may recommend pelvic exams and Pap testing every 3 years, or they may recommend the Pap and pelvic exam, combined with testing for human papilloma virus (HPV), every 5 years. Some types of HPV increase your risk of cervical cancer. Testing for HPV may also be done on women of any age with unclear Pap test results.  Other health care providers may not recommend any screening for nonpregnant women who are considered low risk for pelvic cancer and who do not have symptoms. Ask your health care provider if a screening pelvic exam is right for you.  If you have had past treatment for cervical cancer or a condition that could lead to cancer, you need Pap tests and screening for cancer for at least 20 years after your treatment. If Pap tests have been discontinued, your risk factors (such as having a new sexual partner) need to be reassessed to determine if screening should resume. Some women have medical problems that increase the chance of getting cervical cancer. In these cases, your health care provider may recommend more frequent screening and Pap tests.  Colorectal Cancer  This type of cancer can be detected and often prevented.  Routine colorectal cancer screening usually begins at 61 years of age and continues through 61 years of age.  Your health care provider may recommend screening at an earlier age if you have risk factors for colon cancer.  Your health care provider may also recommend using home test kits to check for hidden blood in the stool.  A small camera at the end of a tube can be used to examine your colon  directly (sigmoidoscopy or colonoscopy). This is done to check for the earliest forms of colorectal cancer.  Routine screening usually begins at age 62.  Direct examination of the colon should be repeated every 5-10 years through 61 years of age. However, you may need to be screened more often if early forms of precancerous polyps or small growths are found.  Skin Cancer  Check your skin from head to toe regularly.  Tell your health care provider about any new moles or changes in moles, especially if there is a change in a mole's shape or color.  Also tell your health care provider if you have a mole that is larger than the size of a pencil eraser.  Always use sunscreen. Apply sunscreen liberally and repeatedly throughout the day.  Protect yourself by wearing long sleeves, pants, a wide-brimmed hat, and sunglasses whenever you are outside.  Heart disease, diabetes, and high blood pressure  High blood pressure causes heart disease and increases  the risk of stroke. High blood pressure is more likely to develop in: ? People who have blood pressure in the high end of the normal range (130-139/85-89 mm Hg). ? People who are overweight or obese. ? People who are African American.  If you are 72-28 years of age, have your blood pressure checked every 3-5 years. If you are 1 years of age or older, have your blood pressure checked every year. You should have your blood pressure measured twice-once when you are at a hospital or clinic, and once when you are not at a hospital or clinic. Record the average of the two measurements. To check your blood pressure when you are not at a hospital or clinic, you can use: ? An automated blood pressure machine at a pharmacy. ? A home blood pressure monitor.  If you are between 59 years and 56 years old, ask your health care provider if you should take aspirin to prevent strokes.  Have regular diabetes screenings. This involves taking a blood sample to  check your fasting blood sugar level. ? If you are at a normal weight and have a low risk for diabetes, have this test once every three years after 61 years of age. ? If you are overweight and have a high risk for diabetes, consider being tested at a younger age or more often. Preventing infection Hepatitis B  If you have a higher risk for hepatitis B, you should be screened for this virus. You are considered at high risk for hepatitis B if: ? You were born in a country where hepatitis B is common. Ask your health care provider which countries are considered high risk. ? Your parents were born in a high-risk country, and you have not been immunized against hepatitis B (hepatitis B vaccine). ? You have HIV or AIDS. ? You use needles to inject street drugs. ? You live with someone who has hepatitis B. ? You have had sex with someone who has hepatitis B. ? You get hemodialysis treatment. ? You take certain medicines for conditions, including cancer, organ transplantation, and autoimmune conditions.  Hepatitis C  Blood testing is recommended for: ? Everyone born from [birth year] through 1964. ? Anyone with known risk factors for hepatitis C.  Sexually transmitted infections (STIs)  You should be screened for sexually transmitted infections (STIs) including gonorrhea and chlamydia if: ? You are sexually active and are younger than 61 years of age. ? You are older than 61 years of age and your health care provider tells you that you are at risk for this type of infection. ? Your sexual activity has changed since you were last screened and you are at an increased risk for chlamydia or gonorrhea. Ask your health care provider if you are at risk.  If you do not have HIV, but are at risk, it may be recommended that you take a prescription medicine daily to prevent HIV infection. This is called pre-exposure prophylaxis (PrEP). You are considered at risk if: ? You are sexually active and do not regularly  use condoms or know the HIV status of your partner(s). ? You take drugs by injection. ? You are sexually active with a partner who has HIV.  Talk with your health care provider about whether you are at high risk of being infected with HIV. If you choose to begin PrEP, you should first be tested for HIV. You should then be tested every 3 months for as long as you are taking PrEP.  Pregnancy  If you are premenopausal and you may become pregnant, ask your health care provider about preconception counseling.  If you may become pregnant, take 400 to 800 micrograms (mcg) of folic acid every day.  If you want to prevent pregnancy, talk to your health care provider about birth control (contraception). Osteoporosis and menopause  Osteoporosis is a disease in which the bones lose minerals and strength with aging. This can result in serious bone fractures. Your risk for osteoporosis can be identified using a bone density scan.  If you are 36 years of age or older, or if you are at risk for osteoporosis and fractures, ask your health care provider if you should be screened.  Ask your health care provider whether you should take a calcium or vitamin D supplement to lower your risk for osteoporosis.  Menopause may have certain physical symptoms and risks.  Hormone replacement therapy may reduce some of these symptoms and risks. Talk to your health care provider about whether hormone replacement therapy is right for you. Follow these instructions at home:  Schedule regular health, dental, and eye exams.  Stay current with your immunizations.  Do not use any tobacco products including cigarettes, chewing tobacco, or electronic cigarettes.  If you are pregnant, do not drink alcohol.  If you are breastfeeding, limit how much and how often you drink alcohol.  Limit alcohol intake to no more than 1 drink per day for nonpregnant women. One drink equals 12 ounces of beer, 5 ounces of wine, or 1 ounces  of hard liquor.  Do not use street drugs.  Do not share needles.  Ask your health care provider for help if you need support or information about quitting drugs.  Tell your health care provider if you often feel depressed.  Tell your health care provider if you have ever been abused or do not feel safe at home. This information is not intended to replace advice given to you by your health care provider. Make sure you discuss any questions you have with your health care provider. Document Released: 07/21/2010 Document Revised: 06/13/2015 Document Reviewed: 10/09/2014 Elsevier Interactive Patient Education  [employer].

## 2017-02-08 NOTE — Assessment & Plan Note (Signed)
Checking labs, flu and tetanus shot done today. Placed on shingrix waiting list. Colonoscopy and mammogram up to date. Pap smear not needed. Counseled about sun safety and mole surveillance. Given screening recommendations.

## 2017-02-08 NOTE — Progress Notes (Signed)
   Subjective:    Patient ID: Samantha Soto, female    DOB: 06/14/1955, 62 y.o.   MRN: [id number]  HPI The patient is a 62 YO female coming in for physical. Has had wellness screening at her job with HgA1c 7.8 which is improved from prior. 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   [facility] up to date  [facility], social history reviewed and updated.   Review of Systems  Constitutional: Negative.   HENT: Negative.   Eyes: Negative.   Respiratory: Negative for cough, chest tightness and shortness of breath.   Cardiovascular: Negative for chest pain, palpitations and leg swelling.  Gastrointestinal: Negative for abdominal distention, abdominal pain, constipation, diarrhea, nausea and vomiting.  Musculoskeletal: Positive for arthralgias.  Skin: Negative.   Neurological: Negative.   Psychiatric/Behavioral: Negative.       Objective:   Physical Exam  Constitutional: She is oriented to person, place, and time. She appears well-developed and well-nourished.  HENT:  Head: Normocephalic and atraumatic.  Eyes: EOM are normal.  Neck: Normal range of motion.  Cardiovascular: Normal rate and regular rhythm.  Pulmonary/Chest: Effort normal and breath sounds normal. No respiratory distress. She has no wheezes. She has no rales.  Abdominal: Soft. Bowel sounds are normal. She exhibits no distension. There is no tenderness. There is no rebound.  Musculoskeletal: She exhibits no edema.  Neurological: She is alert and oriented to person, place, and time. Coordination normal.  Skin: Skin is warm and dry.  Foot exam done  Psychiatric: She has a normal mood and affect.   Vitals:   02/08/17 0913  BP: 122/76  Pulse: 78  Temp: 97.9 F (36.6 C)  TempSrc: Oral  SpO2: 99%  Weight: 149 lb 0.6 oz (67.6 kg)  Height: 5\' 2"  (1.575 m)      Assessment & Plan:  Flu and tetanus given at visit

## 2017-02-08 NOTE — Assessment & Plan Note (Signed)
BP at goal on amlodipine. Checking CMP and adjust as needed.  

## 2017-02-09 ENCOUNTER — Other Ambulatory Visit: Payer: Self-pay | Admitting: Internal Medicine

## 2017-02-09 MED ORDER — ATORVASTATIN CALCIUM 20 MG PO TABS: 20.0000 mg | ORAL_TABLET | Freq: Every day | ORAL | 3 refills | Status: DC

## 2017-02-11 ENCOUNTER — Telehealth: Payer: Self-pay | Admitting: Internal Medicine

## 2017-02-11 NOTE — Telephone Encounter (Signed)
Copied from CRM [id number]. Topic: Quick Communication - See Telephone Encounter >> Feb 11, 2017  9:16 AM Trula SladeWalter, Linda F wrote: CRM for notification. See Telephone encounter for:  02/11/17. Patient wanted to inform the doctor that she no longer uses: [id number] - [place], [place] - [address] [phone] (Phone) [phone] (Fax)    She would like any future prescriptions sent to:  [facility] [id number] - [place], [place] - [address] AT [address] [phone] (Phone) [phone] (Fax)

## 2017-02-11 NOTE — Telephone Encounter (Signed)
Pharmacies updated in chart

## 2017-02-16 ENCOUNTER — Telehealth: Payer: Self-pay | Admitting: Internal Medicine

## 2017-02-16 NOTE — Telephone Encounter (Signed)
Pt called in to request a call back. She said that she would like to have Rx for Metformin 1000 MG instead of Osmosis due to cost. [facility] [address] [facility]   CB: [phone]

## 2017-02-17 ENCOUNTER — Other Ambulatory Visit: Payer: Self-pay

## 2017-02-17 MED ORDER — METFORMIN HCL ER (MOD) 1000 MG PO TB24: 1000.0000 mg | ORAL_TABLET | Freq: Every day | ORAL | 3 refills | Status: DC

## 2017-02-17 NOTE — Telephone Encounter (Signed)
LVM for patient informing her that the metformin 1000mg  was in fact the medication that was sent in, but it was sent to a different pharmacy. [facility] [address] and told her to call back if she has any questions

## 2017-02-19 ENCOUNTER — Other Ambulatory Visit: Payer: Self-pay

## 2017-02-19 MED ORDER — METFORMIN HCL ER 500 MG PO TB24: 1000.0000 mg | ORAL_TABLET | Freq: Every day | ORAL | 3 refills | Status: AC

## 2017-02-19 MED ORDER — ATORVASTATIN CALCIUM 20 MG PO TABS: 20.0000 mg | ORAL_TABLET | Freq: Every day | ORAL | 3 refills | Status: DC

## 2017-02-19 MED ORDER — DICLOFENAC SODIUM 1 % TD GEL: 2.0000 g | Freq: Four times a day (QID) | TRANSDERMAL | 6 refills | Status: AC

## 2017-02-19 NOTE — Telephone Encounter (Signed)
Please advise, the atorvastatin has already been sent in

## 2017-02-19 NOTE — Telephone Encounter (Signed)
Pt called in, I made her aware of Rx being sent in. Pt says that Now, instead she would like to have the Metformin ER 500 MG tablet (extended relief), [facility] [address] [facility] due to cost.  Pt say that she also need a refill on her Atorvastatin.     Please advise.

## 2017-02-19 NOTE — Telephone Encounter (Signed)
[facility] ER [facility]. [facility] if she is not going there anymore? If [facility] you will need to resend.

## 2017-02-19 NOTE — Telephone Encounter (Signed)
Informed patient and [facility] the [facility] and re-sent the [facility]vastatin [facility]ac [facility]

## 2017-03-02 ENCOUNTER — Encounter: Payer: 59 | Attending: Internal Medicine | Admitting: *Deleted

## 2017-03-02 DIAGNOSIS — E119 Type 2 diabetes mellitus without complications: Secondary | ICD-10-CM | POA: Diagnosis present

## 2017-03-02 DIAGNOSIS — Z713 Dietary counseling and surveillance: Secondary | ICD-10-CM | POA: Diagnosis not present

## 2017-03-02 NOTE — Patient Instructions (Signed)
Plan:  Aim for 2-3 Carb Choices per meal (30-45 grams)  Aim for 0-1 Carbs per snack if hungry  Include protein in moderation with your meals and snacks Consider reading food labels for Total Carbohydrate of foods Continue with your activity level and increase as tolerated Consider checking BG at alternate times per day as tolerated  Continue taking medication as directed by MD

## 2017-03-02 NOTE — Progress Notes (Signed)
Diabetes Self-Management Education  Visit Type: First/Initial  Appt. Start Time: 1410 Appt. End Time: 1510  03/02/2017  Samantha Soto, identified by name and date of birth, is a 62 y.o. female with a diagnosis of Diabetes: Type 2. 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 She prepares all meals herself. She exercises twice a week. She does not have a meter to check her BG yet.  ASSESSMENT  There were no vitals taken for this visit. There is no height or weight on file to calculate BMI.  Diabetes Self-Management Education - 03/02/17 1402      Visit Information   Visit Type  First/Initial      Initial Visit   Diabetes Type  Type 2    Are you currently following a meal plan?  No    Are you taking your medications as prescribed?  Yes      Health Coping   How would you rate your overall health?  Good      Psychosocial Assessment   Patient Belief/Attitude about Diabetes  Motivated to manage diabetes    Self-care barriers  None    Other persons present  Patient    Patient Concerns  Nutrition/Meal planning;Glycemic Control    Special Needs  None    Learning Readiness  Ready    How often do you need to have someone help you when you read instructions, pamphlets, or other written materials from your doctor or pharmacy?  1 - Never    What is the last grade level you completed in school?  Masters      Pre-Education Assessment   Patient understands the diabetes disease and treatment process.  Needs Instruction    Patient understands incorporating nutritional management into lifestyle.  Needs Instruction    Patient undertands incorporating physical activity into lifestyle.  Needs Instruction    Patient understands using medications safely.  Needs Instruction    Patient understands monitoring blood glucose, interpreting and using results  Needs Instruction    Patient understands prevention, detection, and treatment of acute complications.  Needs Instruction    Patient  understands prevention, detection, and treatment of chronic complications.  Needs Instruction    Patient understands how to develop strategies to address psychosocial issues.  Needs Instruction    Patient understands how to develop strategies to promote health/change behavior.  Needs Instruction      Complications   How often do you check your blood sugar?  Not recommended by provider    Have you had a dilated eye exam in the past 12 months?  Yes    Have you had a dental exam in the past 12 months?  No    Are you checking your feet?  Yes    How many days per week are you checking your feet?  5      Dietary Intake   Breakfast  protein shake OR oatmeal OR toast ane eggs OR cereal    Lunch  cooks meals for childcare children, eats when she can; meat, vegetables, fruit    Snack (afternoon)  tiny piece of chocolate maybe 3 days a week    Dinner  hot meal with meat, starch, and vegetables,, fruit    Snack (evening)  popcorn    Beverage(s)  coffee, green tea, water      Exercise   Exercise Type  Light (walking / raking leaves)    How many days per week to you exercise?  2    How  many minutes per day do you exercise?  40    Total minutes per week of exercise  80      Patient Education   Previous Diabetes Education  No    Disease state   Definition of diabetes, type 1 and 2, and the diagnosis of diabetes    Nutrition management   Role of diet in the treatment of diabetes and the relationship between the three main macronutrients and blood glucose level;Food label reading, portion sizes and measuring food.;Carbohydrate counting;Reviewed blood glucose goals for pre and post meals and how to evaluate the patients' food intake on their blood glucose level.    Physical activity and exercise   Role of exercise on diabetes management, blood pressure control and cardiac health.    Medications  Reviewed patients medication for diabetes, action, purpose, timing of dose and side effects.    Monitoring   Purpose and frequency of SMBG.;Taught/discussed recording of test results and interpretation of SMBG.;Identified appropriate SMBG and/or A1C goals.    Acute complications  Other (comment)    Chronic complications  Relationship between chronic complications and blood glucose control    Psychosocial adjustment  Role of stress on diabetes      Individualized Goals (developed by patient)   Nutrition  Follow meal plan discussed    Physical Activity  Exercise 3-5 times per week    Medications  take my medication as prescribed    Monitoring   test blood glucose pre and post meals as discussed    Reducing Risk  examine blood glucose patterns      Outcomes   Expected Outcomes  Demonstrated interest in learning. Expect positive outcomes    Future DMSE  PRN    Program Status  Not Completed       Individualized Plan for Diabetes Self-Management Training:   Learning Objective:  Patient will have a greater understanding of diabetes self-management. Patient education plan is to attend individual and/or group sessions per assessed needs and concerns.   Plan:   Patient Instructions  Plan:  Aim for 2-3 Carb Choices per meal (30-45 grams)  Aim for 0-1 Carbs per snack if hungry  Include protein in moderation with your meals and snacks Consider reading food labels for Total Carbohydrate of foods Continue with your activity level and increase as tolerated Consider checking BG at alternate times per day as tolerated  Continue taking medication as directed by MD  Expected Outcomes:  Demonstrated interest in learning. Expect positive outcomes  Education material provided: Living Well with Diabetes, A1C conversion sheet, Meal plan card and Carbohydrate counting sheet  If problems or questions, patient to contact team via:  Phone  Future DSME appointment: PRN

## 2017-03-04 ENCOUNTER — Other Ambulatory Visit: Payer: Self-pay | Admitting: Internal Medicine

## 2017-06-08 ENCOUNTER — Telehealth: Payer: Self-pay | Admitting: Internal Medicine

## 2017-06-08 NOTE — Telephone Encounter (Signed)
Copied from CRM [id number]. Topic: Quick Communication - See Telephone Encounter >> Jun 08, 2017  4:39 PM Lorrine Kin, [place] wrote: CRM for notification. See Telephone encounter for: 06/08/17. Patient calling and states that she has a rash on her arms and hands. States that she thinks its from the heat. Would like a cream called in to [facility] - [place], [place] - [address] Please advise. CB#: [phone]

## 2017-06-09 MED ORDER — TRIAMCINOLONE ACETONIDE 0.1 % EX CREA
1.0000 | TOPICAL_CREAM | Freq: Two times a day (BID) | CUTANEOUS | 1 refills | Status: AC
Start: 2017-06-09 — End: ?

## 2017-06-09 NOTE — Telephone Encounter (Signed)
Patient informed. 

## 2017-06-09 NOTE — Telephone Encounter (Signed)
Sent in triamcinolone

## 2017-06-09 NOTE — Telephone Encounter (Signed)
Patient states it is an itchy rash and that she usually get the triamcinolone cream sent in and wants to know if she can get that, but will also get the benadryl

## 2017-06-09 NOTE — Telephone Encounter (Signed)
Can try benadryl cream otc or hydrocortisone cream otc. Is she talking about a sunburn or some kind of itchy rash?

## 2017-07-08 ENCOUNTER — Ambulatory Visit (INDEPENDENT_AMBULATORY_CARE_PROVIDER_SITE_OTHER): Payer: 59 | Admitting: *Deleted

## 2017-07-08 DIAGNOSIS — Z23 Encounter for immunization: Secondary | ICD-10-CM

## 2017-12-23 ENCOUNTER — Encounter: Payer: Self-pay | Admitting: Internal Medicine

## 2017-12-23 ENCOUNTER — Ambulatory Visit (INDEPENDENT_AMBULATORY_CARE_PROVIDER_SITE_OTHER): Payer: 59 | Admitting: Internal Medicine

## 2017-12-23 DIAGNOSIS — M25511 Pain in right shoulder: Secondary | ICD-10-CM | POA: Insufficient documentation

## 2017-12-23 MED ORDER — PREDNISONE 20 MG PO TABS: 40.0000 mg | ORAL_TABLET | Freq: Every day | ORAL | 0 refills | Status: AC

## 2017-12-23 NOTE — Progress Notes (Signed)
   Subjective:    Patient ID: Samantha Soto, female    DOB: 12/12/1955, 62 y.o.   MRN: [id number]  HPI The patient is a 62 YO female coming in for right shoulder pain. Started about a week or so ago. 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 She denies numbness or weakness down her arm. Hurts with any activity. She is icing it midday and taking ibuprofen for the pain which helps. She denies swelling. Denies falls or twist injury. Left shoulder is hurting a little bit as well. Overall stable since onset. Denies worsening or improvement.   Review of Systems  Constitutional: Positive for activity change. Negative for appetite change, chills, fatigue, fever and unexpected weight change.  Respiratory: Negative.   Cardiovascular: Negative.   Gastrointestinal: Negative.   Musculoskeletal: Positive for arthralgias and myalgias. Negative for back pain, gait problem and joint swelling.  Skin: Negative.   Neurological: Negative.       Objective:   Physical Exam  Constitutional: She is oriented to person, place, and time. She appears well-developed and well-nourished.  HENT:  Head: Normocephalic and atraumatic.  Eyes: EOM are normal.  Neck: Normal range of motion.  Cardiovascular: Normal rate and regular rhythm.  Pulmonary/Chest: Effort normal and breath sounds normal. No respiratory distress. She has no wheezes. She has no rales.  Musculoskeletal: She exhibits tenderness. She exhibits no edema.  Pain in the [facility] joint and trapezius muscle right back as well as right neck and pectoral region  Neurological: She is alert and oriented to person, place, and time. Coordination normal.  Skin: Skin is warm and dry.   Vitals:   12/23/17 0820  BP: 140/70  Pulse: (!) 105  Temp: 98.2 F (36.8 C)  TempSrc: Oral  SpO2: 96%  Weight: 141 lb (64 kg)  Height: 5\' 2"  (1.575 m)      Assessment & Plan:

## 2017-12-23 NOTE — Assessment & Plan Note (Signed)
Suspect sprain due to overuse at work. She is given rx for prednisone burst to see if this helps. If not she is asked to see sports medicine for evaluation and treatment. Continue ibuprofen or add tylenol for pain. Continue icing as needed.

## 2017-12-23 NOTE — Patient Instructions (Signed)
We have sent in prednisone to take 2 pills daily for 5 days to see if this helps the shoulder.   If not we will have you see sports medicine to check out the shoulders.

## 2018-01-07 ENCOUNTER — Telehealth: Payer: Self-pay | Admitting: Internal Medicine

## 2018-01-07 NOTE — Telephone Encounter (Signed)
Copied from CRM [id number]. Topic: Quick Communication - See Telephone Encounter >> Jan 07, 2018  3:09 PM Lorrine KinMcGee, Rylen Hou B, [place] wrote: CRM for notification. See Telephone encounter for: 01/07/18. Patient calling and states that she had seen Dr Okey Duprerawford on 12/23/17. States that she is still having pain. Would like to know if Dr Okey Duprerawford could prescribe an 800mg  pain medication? Please advise.  [facility] [address]- [place], [place] - [address]

## 2018-01-10 MED ORDER — IBUPROFEN 800 MG PO TABS: 800.0000 mg | ORAL_TABLET | Freq: Three times a day (TID) | ORAL | 0 refills | Status: AC | PRN

## 2018-01-10 NOTE — Telephone Encounter (Signed)
Sent in ibuprofen and she can start PT does not need x-ray.

## 2018-01-10 NOTE — Telephone Encounter (Signed)
Is the patient asking for ibuprofen 800 mg? Or is she just asking for some kind of pain medication this note is not clear.

## 2018-01-10 NOTE — Telephone Encounter (Signed)
Patient said Ibuprofen 800. Patient is also wondering if she needs an x-ray of her shoulder before she starts PT to make sure nothing is broken. Is still unable to do every day activities and ROM is limited. Currently taking OTC ibuprofen

## 2018-01-10 NOTE — Telephone Encounter (Signed)
Patient informed of MD response  

## 2018-02-03 ENCOUNTER — Other Ambulatory Visit: Payer: Self-pay | Admitting: Internal Medicine

## 2018-02-03 DIAGNOSIS — Z1231 Encounter for screening mammogram for malignant neoplasm of breast: Secondary | ICD-10-CM

## 2018-02-08 ENCOUNTER — Ambulatory Visit: Payer: 59 | Admitting: Family Medicine

## 2018-02-08 NOTE — Progress Notes (Deleted)
  Adryana Gallop - 63 y.o. female MRN [id number]  Date of birth: [birth year]  SUBJECTIVE:  Including CC & ROS.  No chief complaint on file.   Irasema Croft is a 63 y.o. female that is  ***.  ***   Review of Systems  HISTORY: Past Medical, Surgical, Social, and Family History Reviewed & Updated per EMR.   Pertinent Historical Findings include:  Past Medical History:  Diagnosis Date  . Hyperlipidemia   . Hypertension     Past Surgical History:  Procedure Laterality Date  . ABDOMINAL HYSTERECTOMY    . KNEE SURGERY Right 2000    No Known Allergies  Family History  Problem Relation Age of Onset  . Diabetes Sister   . Hyperlipidemia Sister   . Diabetes Brother      Social History   Socioeconomic History  . Marital status: Single    Spouse name: Not on file  . Number of children: Not on file  . Years of education: Not on file  . Highest education level: Not on file  Occupational History  . Not on file  Social Needs  . Financial resource strain: Not on file  . Food insecurity:    Worry: Not on file    Inability: Not on file  . Transportation needs:    Medical: Not on file    Non-medical: Not on file  Tobacco Use  . Smoking status: Never Smoker  . Smokeless tobacco: Never Used  Substance and Sexual Activity  . Alcohol use: No  . Drug use: No  . Sexual activity: Not on file  Lifestyle  . Physical activity:    Days per week: Not on file    Minutes per session: Not on file  . Stress: Not on file  Relationships  . Social connections:    Talks on phone: Not on file    Gets together: Not on file    Attends religious service: Not on file    Active member of club or organization: Not on file    Attends meetings of clubs or organizations: Not on file    Relationship status: Not on file  . Intimate partner violence:    Fear of current or ex partner: Not on file    Emotionally abused: Not on file    Physically abused: Not on file    Forced sexual  activity: Not on file  Other Topics Concern  . Not on file  Social History Narrative  . Not on file     PHYSICAL EXAM:  VS: There were no vitals taken for this visit. Physical Exam Gen: NAD, alert, cooperative with exam, well-appearing ENT: normal lips, normal nasal mucosa,  Eye: normal EOM, normal conjunctiva and lids CV:  no edema, +2 pedal pulses   Resp: no accessory muscle use, non-labored,  GI: no masses or tenderness, no hernia  Skin: no rashes, no areas of induration  Neuro: normal tone, normal sensation to touch Psych:  normal insight, alert and oriented MSK:  ***      ASSESSMENT & PLAN:   No problem-specific Assessment & Plan notes found for this encounter.

## 2018-03-05 ENCOUNTER — Other Ambulatory Visit: Payer: Self-pay | Admitting: Internal Medicine

## 2018-03-07 ENCOUNTER — Encounter: Payer: 59 | Admitting: Internal Medicine

## 2018-03-07 ENCOUNTER — Ambulatory Visit: Payer: 59

## 2018-04-18 ENCOUNTER — Other Ambulatory Visit: Payer: Self-pay | Admitting: Internal Medicine

## 2018-08-01 ENCOUNTER — Other Ambulatory Visit: Payer: Self-pay | Admitting: Internal Medicine

## 2018-09-22 ENCOUNTER — Other Ambulatory Visit: Payer: Self-pay | Admitting: Internal Medicine

## 2018-09-26 ENCOUNTER — Other Ambulatory Visit: Payer: Self-pay | Admitting: Internal Medicine

## 2019-03-18 ENCOUNTER — Ambulatory Visit: Payer: 59 | Attending: Internal Medicine

## 2019-03-18 DIAGNOSIS — Z23 Encounter for immunization: Secondary | ICD-10-CM | POA: Insufficient documentation

## 2019-03-18 NOTE — Progress Notes (Signed)
   Covid-19 Vaccination Clinic  Name:  Braelynne Garinger    MRN: [id number] DOB: 11-05-1955  03/18/2019  Ms. Maragh was observed post Covid-19 immunization for 15 minutes without incidence. She was provided with Vaccine Information Sheet and instruction to access the V-Safe system.   Ms. Rothenberger was instructed to call 911 with any severe reactions post vaccine: Marland Kitchen Difficulty breathing  . Swelling of your face and throat  . A fast heartbeat  . A bad rash all over your body  . Dizziness and weakness    Immunizations Administered    Name Date Dose VIS Date Route   Pfizer COVID-19 Vaccine 03/18/2019  9:58 AM 0.3 mL 12/30/2018 Intramuscular   Manufacturer: [employer], [employer]   Lot: [id number]   NDC: [address]-6

## 2019-04-12 ENCOUNTER — Ambulatory Visit: Payer: 59 | Attending: Internal Medicine

## 2019-04-12 DIAGNOSIS — Z23 Encounter for immunization: Secondary | ICD-10-CM

## 2019-04-12 NOTE — Progress Notes (Signed)
   Covid-19 Vaccination Clinic  Name:  Samantha Soto    MRN: [id number] DOB: 07/17/1955  04/12/2019  Samantha Soto was observed post Covid-19 immunization for 15 minutes without incident. She was provided with Vaccine Information Sheet and instruction to access the V-Safe system.   Samantha Soto was instructed to call 911 with any severe reactions post vaccine: Marland Kitchen Difficulty breathing  . Swelling of face and throat  . A fast heartbeat  . A bad rash all over body  . Dizziness and weakness   Immunizations Administered    Name Date Dose VIS Date Route   Pfizer COVID-19 Vaccine 04/12/2019 10:51 AM 0.3 mL 12/30/2018 Intramuscular   Manufacturer: [employer], [employer]   Lot: [id number]   NDC: [address]-6

## 2019-08-13 ENCOUNTER — Other Ambulatory Visit: Payer: Self-pay

## 2019-08-13 ENCOUNTER — Emergency Department (HOSPITAL_COMMUNITY)
Admission: EM | Admit: 2019-08-13 | Discharge: 2019-08-13 | Disposition: A | Discharge status: Home / Self Care | Payer: BLUE CROSS/BLUE SHIELD | Attending: Emergency Medicine | Admitting: Emergency Medicine

## 2019-08-13 ENCOUNTER — Encounter (HOSPITAL_COMMUNITY): Payer: Self-pay

## 2019-08-13 DIAGNOSIS — Z7984 Long term (current) use of oral hypoglycemic drugs: Secondary | ICD-10-CM | POA: Diagnosis not present

## 2019-08-13 DIAGNOSIS — R05 Cough: Secondary | ICD-10-CM | POA: Insufficient documentation

## 2019-08-13 DIAGNOSIS — I2510 Atherosclerotic heart disease of native coronary artery without angina pectoris: Secondary | ICD-10-CM | POA: Diagnosis not present

## 2019-08-13 DIAGNOSIS — T59811A Toxic effect of smoke, accidental (unintentional), initial encounter: Secondary | ICD-10-CM | POA: Diagnosis present

## 2019-08-13 DIAGNOSIS — I119 Hypertensive heart disease without heart failure: Secondary | ICD-10-CM | POA: Diagnosis not present

## 2019-08-13 DIAGNOSIS — Z79899 Other long term (current) drug therapy: Secondary | ICD-10-CM | POA: Insufficient documentation

## 2019-08-13 DIAGNOSIS — E119 Type 2 diabetes mellitus without complications: Secondary | ICD-10-CM | POA: Insufficient documentation

## 2019-08-13 DIAGNOSIS — R110 Nausea: Secondary | ICD-10-CM | POA: Insufficient documentation

## 2019-08-13 DIAGNOSIS — R070 Pain in throat: Secondary | ICD-10-CM | POA: Insufficient documentation

## 2019-08-13 HISTORY — DX: Type 2 diabetes mellitus without complications: E11.9

## 2019-08-13 HISTORY — DX: Atherosclerotic heart disease of native coronary artery without angina pectoris: I25.10

## 2019-08-13 MED ORDER — DEXAMETHASONE 4 MG PO TABS
8.0000 mg | ORAL_TABLET | Freq: Once | ORAL | Status: AC
  Administered 2019-08-13: 8 mg via ORAL
  Filled 2019-08-13: qty 2
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
## 2020-01-31 ENCOUNTER — Other Ambulatory Visit: Payer: Self-pay

## 2020-01-31 DIAGNOSIS — Z20822 Contact with and (suspected) exposure to covid-19: Secondary | ICD-10-CM

## 2020-02-01 LAB — SARS-COV-2, NAA 2 DAY TAT

## 2020-02-01 LAB — NOVEL CORONAVIRUS, NAA: SARS-CoV-2, NAA: NOT DETECTED

## 2020-03-11 ENCOUNTER — Encounter (HOSPITAL_COMMUNITY): Payer: Self-pay

## 2020-10-17 ENCOUNTER — Emergency Department (HOSPITAL_COMMUNITY): Payer: BLUE CROSS/BLUE SHIELD

## 2020-10-17 ENCOUNTER — Other Ambulatory Visit: Payer: Self-pay

## 2020-10-17 ENCOUNTER — Encounter (HOSPITAL_COMMUNITY): Payer: Self-pay

## 2020-10-17 ENCOUNTER — Emergency Department (HOSPITAL_COMMUNITY)
Admission: EM | Admit: 2020-10-17 | Discharge: 2020-10-18 | Disposition: A | Discharge status: Home / Self Care | Payer: BLUE CROSS/BLUE SHIELD | Attending: Emergency Medicine | Admitting: Emergency Medicine

## 2020-10-17 DIAGNOSIS — Z79899 Other long term (current) drug therapy: Secondary | ICD-10-CM | POA: Diagnosis not present

## 2020-10-17 DIAGNOSIS — I2510 Atherosclerotic heart disease of native coronary artery without angina pectoris: Secondary | ICD-10-CM | POA: Insufficient documentation

## 2020-10-17 DIAGNOSIS — Z7984 Long term (current) use of oral hypoglycemic drugs: Secondary | ICD-10-CM | POA: Insufficient documentation

## 2020-10-17 DIAGNOSIS — Y92410 Unspecified street and highway as the place of occurrence of the external cause: Secondary | ICD-10-CM | POA: Insufficient documentation

## 2020-10-17 DIAGNOSIS — R079 Chest pain, unspecified: Secondary | ICD-10-CM | POA: Insufficient documentation

## 2020-10-17 DIAGNOSIS — E119 Type 2 diabetes mellitus without complications: Secondary | ICD-10-CM | POA: Insufficient documentation

## 2020-10-17 DIAGNOSIS — M545 Low back pain, unspecified: Secondary | ICD-10-CM | POA: Diagnosis not present

## 2020-10-17 DIAGNOSIS — I10 Essential (primary) hypertension: Secondary | ICD-10-CM | POA: Diagnosis not present

## 2020-10-17 DIAGNOSIS — M25561 Pain in right knee: Secondary | ICD-10-CM | POA: Insufficient documentation

## 2020-10-17 MED ORDER — CYCLOBENZAPRINE HCL 10 MG PO TABS
10.0000 mg | ORAL_TABLET | Freq: Once | ORAL | Status: AC
  Administered 2020-10-17: 10 mg via ORAL
  Filled 2020-10-17: qty 1
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
## 2020-10-17 NOTE — Discharge Instructions (Addendum)
We discussed the results of your lumbar spine, along with chest x-ray.  I have prescribed a short course of muscle relaxers to help with your symptoms.  You may also take Tylenol or ibuprofen to help with the symptoms.  You may also continue to apply ice or heat to the area to help with soreness.
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
## 2020-10-18 MED ORDER — CYCLOBENZAPRINE HCL 10 MG PO TABS: 10.0000 mg | ORAL_TABLET | Freq: Two times a day (BID) | ORAL | 0 refills | Status: AC | PRN
